# Patient Record
Sex: Female | Born: 1980 | Hispanic: Yes | Marital: Single | Smoking: Never smoker
Health system: Southern US, Community
[De-identification: ages and names within clinical notes are randomized; demographics above are authoritative.]

## PROBLEM LIST (undated history)

## (undated) DIAGNOSIS — I10 Essential (primary) hypertension: Secondary | ICD-10-CM

## (undated) DIAGNOSIS — D219 Benign neoplasm of connective and other soft tissue, unspecified: Secondary | ICD-10-CM

## (undated) DIAGNOSIS — O24419 Gestational diabetes mellitus in pregnancy, unspecified control: Secondary | ICD-10-CM

## (undated) HISTORY — DX: Essential (primary) hypertension: I10

## (undated) HISTORY — DX: Gestational diabetes mellitus in pregnancy, unspecified control: O24.419

## (undated) HISTORY — DX: Benign neoplasm of connective and other soft tissue, unspecified: D21.9

---

## 2014-12-28 ENCOUNTER — Encounter (HOSPITAL_COMMUNITY): Payer: Self-pay

## 2014-12-28 ENCOUNTER — Emergency Department (HOSPITAL_COMMUNITY)
Admission: EM | Admit: 2014-12-28 | Discharge: 2014-12-29 | Disposition: A | Discharge status: Home / Self Care | Payer: Self-pay | Attending: Emergency Medicine | Admitting: Emergency Medicine

## 2014-12-28 DIAGNOSIS — O200 Threatened abortion: Secondary | ICD-10-CM | POA: Insufficient documentation

## 2014-12-28 DIAGNOSIS — O4690 Antepartum hemorrhage, unspecified, unspecified trimester: Secondary | ICD-10-CM

## 2014-12-28 DIAGNOSIS — Z3A01 Less than 8 weeks gestation of pregnancy: Secondary | ICD-10-CM | POA: Insufficient documentation

## 2014-12-28 DIAGNOSIS — N939 Abnormal uterine and vaginal bleeding, unspecified: Secondary | ICD-10-CM

## 2014-12-28 NOTE — ED Notes (Signed)
Pt speaks no english. Used interpreter phone to triage. Pt here for vaginal bleeding that started today. Went to MD 3 days ago for check up and they checked a urine on her and she found out she was pregnant. Has been having vaginal discharge that is white and yellow and has a smell to it for a while. Today noticed some white discharge with blood and wants to be checked out since they told her she was pregnant.

## 2014-12-29 ENCOUNTER — Emergency Department (HOSPITAL_COMMUNITY): Payer: Self-pay

## 2014-12-29 LAB — ABO/RH: ABO/RH(D): O POS

## 2014-12-29 LAB — WET PREP, GENITAL
Clue Cells Wet Prep HPF POC: NONE SEEN
Trich, Wet Prep: NONE SEEN

## 2014-12-29 LAB — I-STAT CHEM 8, ED
BUN: 6 mg/dL (ref 6–20)
Calcium, Ion: 1.16 mmol/L (ref 1.12–1.23)
Chloride: 104 mmol/L (ref 101–111)
Creatinine, Ser: 0.6 mg/dL (ref 0.44–1.00)
Glucose, Bld: 122 mg/dL — ABNORMAL HIGH (ref 65–99)
HEMATOCRIT: 41 % (ref 36.0–46.0)
Hemoglobin: 13.9 g/dL (ref 12.0–15.0)
Potassium: 3.2 mmol/L — ABNORMAL LOW (ref 3.5–5.1)
SODIUM: 140 mmol/L (ref 135–145)
TCO2: 20 mmol/L (ref 0–100)

## 2014-12-29 LAB — GC/CHLAMYDIA PROBE AMP (~~LOC~~) NOT AT ARMC
Chlamydia: NEGATIVE
Neisseria Gonorrhea: NEGATIVE

## 2014-12-29 LAB — HCG, QUANTITATIVE, PREGNANCY: hCG, Beta Chain, Quant, S: 10429 m[IU]/mL — ABNORMAL HIGH (ref <5)

## 2014-12-29 MED ORDER — POTASSIUM CHLORIDE CRYS ER 20 MEQ PO TBCR
40.0000 meq | EXTENDED_RELEASE_TABLET | Freq: Once | ORAL | Status: AC
  Administered 2014-12-29: 40 meq via ORAL
  Filled 2014-12-29: qty 2

## 2014-12-29 NOTE — Discharge Instructions (Signed)
Amenaza de aborto (Threatened Miscarriage) La amenaza de aborto se produce cuando hay hemorragia vaginal durante las primeras 20semanas de [place], pero el embarazo no se interrumpe. Si durante este perodo usted tiene hemorragia vaginal, el mdico le har pruebas para asegurarse de que el embarazo contine. Si las pruebas muestran que usted contina embarazada y que el "beb" en desarrollo (feto) dentro del tero sigue creciendo, se considera que tuvo una [place] de aborto. La amenaza de aborto no implica que el embarazo vaya a Manufacturing engineer, pero s aumenta el riesgo de perder el embarazo (aborto completo). CAUSAS  Por lo general, no se conoce la causa de la amenaza de aborto. Si el resultado final es el aborto completo, la causa ms frecuente es la cantidad anormal de cromosomas del feto. Los cromosomas son las estructuras internas de las clulas que contienen todo Agricultural engineer gentico. Clinical research associate de las causas de hemorragia vaginal que no ocasionan un aborto incluyen:  [place].  [place].  Los cambios hormonales normales durante el [place].  La hemorragia que se produce cuando el vulo se implanta en el tero. FACTORES DE RIESGO Los factores de riesgo de hemorragia al principio del embarazo incluyen:  Obesidad.  Fumar.  El consumo de cantidades excesivas de alcohol o cafena.  El consumo de drogas. SIGNOS Y SNTOMAS  Hemorragia vaginal leve.  Dolor o clicos abdominales leves. DIAGNSTICO  Si tiene hemorragia con o sin dolor abdominal antes de las 20semanas de [place], el mdico le har pruebas para determinar si el embarazo contina. Una prueba importante incluye el uso de ondas sonoras y de una computadora (ecografa) para crear imgenes del interior del tero. Otras pruebas incluyen el examen interno de la vagina y el tero (examen plvico), y el control de la frecuencia cardaca del feto.  Es posible que le diagnostiquen una amenaza de aborto en los  siguientes casos:  La ecografa muestra que el embarazo contina.  La frecuencia cardaca del feto es alta.  El examen plvico muestra que la apertura entre el tero y la vagina (cuello del tero) est cerrada.  Su frecuencia cardaca y su presin arterial estn estables.  Los [employer] de sangre confirman que el embarazo contina. TRATAMIENTO  No se ha demostrado que ningn tratamiento evite que una amenaza de aborto se [place] en un aborto completo. Sin embargo, los cuidados [employer] hogar son importantes.  INSTRUCCIONES PARA EL CUIDADO EN EL HOGAR   Asegrese de asistir a todas las citas de cuidados prenatales. Esto es [employer].  Descanse lo suficiente.  No tenga relaciones sexuales ni use tampones si tiene hemorragia vaginal.  No se haga duchas vaginales.  No fume ni consuma drogas.  No beba alcohol.  Evite la cafena. SOLICITE ATENCIN MDICA SI:  Tiene una ligera hemorragia o manchado vaginal durante el embarazo.  Tiene dolor o clicos en el abdomen.  Tiene fiebre. SOLICITE ATENCIN MDICA DE INMEDIATO SI:  Tiene una hemorragia vaginal abundante.  Elimina cogulos de sangre por la vagina.  Siente dolor en la parte baja de la espalda o clicos abdominales intensos.  Tiene fiebre, escalofros y dolor abdominal intenso. ASEGRESE DE QUE:  Comprende estas instrucciones.  Controlar su afeccin.  Recibir ayuda de inmediato si no mejora o si empeora. Document Released: 01/30/2005 Document Revised: 04/27/2013 [facility] Patient Information 2015 [place]. This information is not intended to replace advice given to you by your health care provider. Make sure you discuss any questions you have with your health care provider.

## 2014-12-29 NOTE — ED Notes (Signed)
EDP at bedside  

## 2014-12-29 NOTE — ED Notes (Signed)
Pt to US.

## 2014-12-29 NOTE — ED Provider Notes (Signed)
CSN: [id number]     Arrival date & time 12/28/14  2300 History  This chart was scribed for Julianne Rice, MD by Meriel Pica, ED Scribe. This patient was seen in room A03C/A03C and the patient's care was started 1:34 AM.   Chief Complaint  Patient presents with  . Vaginal Bleeding   The history is provided by the patient. A language interpreter was used.   HPI Comments: Cassandra Stokes is a 34 y.o. female, who is G:1 P:0, presents to the Emergency Department complaining of a sudden onset episode of vaginal bleeding that occurred 5.5 hours ago. Pt reports this evening she experienced a 'gush' of blood that she describes to be heavier than her baseline menses with 1 blood clot this evening at 8pm. She states following the heavy gush of blood she has experienced mild vaginal spotting. LNMP 7/31. She has not received prenatal care for this pregnancy as of yet. She is unaware of her blood type. Denies abdominal pain, pelvic pain nausea, vomiting, fevers, chills, CP, or dysuria.    History reviewed. No pertinent past medical history. History reviewed. No pertinent past surgical history. No family history on file. Social History  Substance Use Topics  . Smoking status: Never Smoker   . Smokeless tobacco: None  . Alcohol Use: No   OB History    Gravida Para Term Preterm AB TAB SAB Ectopic Multiple Living   1              Review of Systems  Constitutional: Negative for fever and chills.  Gastrointestinal: Negative for nausea, vomiting and abdominal pain.  Genitourinary: Positive for vaginal bleeding and vaginal discharge. Negative for dysuria, flank pain, vaginal pain and pelvic pain.  Musculoskeletal: Negative for back pain.  Skin: Negative for rash.  Neurological: Negative for dizziness, weakness, light-headedness, numbness and headaches.  All other systems reviewed and are negative.  Allergies  Review of patient's allergies indicates no known allergies.  Home  Medications   Prior to Admission medications   Not on File   BP 116/91 mmHg  Pulse 98  Temp(Src) 99 F (37.2 C) (Oral)  Resp 20  SpO2 98%  LMP 12/04/2014 Physical Exam  Constitutional: She is oriented to person, place, and time. She appears well-developed and well-nourished. No distress.  HENT:  Head: Normocephalic and atraumatic.  Mouth/Throat: Oropharynx is clear and moist.  Eyes: EOM are normal. Pupils are equal, round, and reactive to light.  Neck: Normal range of motion. Neck supple.  Cardiovascular: Normal rate and regular rhythm.   Pulmonary/Chest: Effort normal and breath sounds normal. No respiratory distress. She has no wheezes. She has no rales.  Abdominal: Soft. Bowel sounds are normal. She exhibits no distension and no mass. There is no tenderness. There is no rebound and no guarding.  Genitourinary:  No pelvic tenderness. Small amount of brown discharge.  Musculoskeletal: Normal range of motion. She exhibits no edema or tenderness.  No CVA tenderness bilaterally  Neurological: She is alert and oriented to person, place, and time.  Skin: Skin is warm and dry. No rash noted. No erythema.  Psychiatric: She has a normal mood and affect. Her behavior is normal.  Nursing note and vitals reviewed.   ED Course  Procedures  DIAGNOSTIC STUDIES: Oxygen Saturation is 98% on RA, normal by my interpretation.    COORDINATION OF CARE: 1:41 AM Discussed treatment plan with pt. Pt acknowledges and agrees to plan.    Labs Review Labs Reviewed  HCG, QUANTITATIVE, PREGNANCY -  Abnormal; Notable for the following:    hCG, Beta Chain, Quant, S 10429 (*)    All other components within normal limits  I-STAT CHEM 8, ED - Abnormal; Notable for the following:    Potassium 3.2 (*)    Glucose, Bld 122 (*)    All other components within normal limits  WET PREP, GENITAL  ABO/RH  GC/CHLAMYDIA PROBE AMP ([place]) NOT AT [facility]    Imaging Review US Ob Comp Less 14 Wks  12/29/2014    CLINICAL DATA:  Vaginal bleeding in first-trimester pregnancy.  EXAM: OBSTETRIC <14 WK [place] AND TRANSVAGINAL OB US  TECHNIQUE: Both transabdominal and transvaginal ultrasound examinations were performed for complete evaluation of the gestation as well as the maternal uterus, adnexal regions, and pelvic cul-de-sac. Transvaginal technique was performed to assess early pregnancy.  COMPARISON:  None.  FINDINGS: Intrauterine gestational sac: Visualized/normal in shape.  Yolk sac:  Not present.  Embryo:  Present.  Cardiac Activity: Present.  Heart Rate: 149  bpm  CRL:  9  mm   6 w   6 d                  [place] EDC: 08/18/2015  Maternal uterus/adnexae: There is no subchorionic hemorrhage. Heterogeneous myometrial lesions most consistent with fibroids. In the fundus fibroid measures 3.3 x 2.4 cm. Probable exophytic fibroid to the left measures 6.3 x 3.8 x 4.0 cm. The ovaries are not discretely visualized, the structure labeled left ovary on transabdominal exam appears to represent an exophytic fibroid. There is an anechoic cyst superior to the uterus measures 6.1 x 4.2 x 4.8 cm, likely ovarian in etiology, however no definite ovarian tissue is seen. An 2.8 x 2.2 x 2.2 cm cyst is seen to the left of the uterus. Small amount of free fluid in the pelvis.  IMPRESSION: 1. Single live intrauterine pregnancy estimated gestational age [redacted] weeks 6 days for estimated date of delivery 08/18/2015. 2. Uterine fibroids with probable exophytic fibroid on the left. 3. The ovaries are not discretely identified, however adnexal cysts are likely ovarian in etiology. Largest cyst measures 6.1 cm superior to the uterus.   Electronically Signed   By: Jeb Levering M.D.   On: 12/29/2014 04:28   US Ob Transvaginal  12/29/2014   CLINICAL DATA:  Vaginal bleeding in first-trimester pregnancy.  EXAM: OBSTETRIC <14 WK [place] AND TRANSVAGINAL OB US  TECHNIQUE: Both transabdominal and transvaginal ultrasound examinations were performed for complete evaluation  of the gestation as well as the maternal uterus, adnexal regions, and pelvic cul-de-sac. Transvaginal technique was performed to assess early pregnancy.  COMPARISON:  None.  FINDINGS: Intrauterine gestational sac: Visualized/normal in shape.  Yolk sac:  Not present.  Embryo:  Present.  Cardiac Activity: Present.  Heart Rate: 149  bpm  CRL:  9  mm   6 w   6 d                  [place] EDC: 08/18/2015  Maternal uterus/adnexae: There is no subchorionic hemorrhage. Heterogeneous myometrial lesions most consistent with fibroids. In the fundus fibroid measures 3.3 x 2.4 cm. Probable exophytic fibroid to the left measures 6.3 x 3.8 x 4.0 cm. The ovaries are not discretely visualized, the structure labeled left ovary on transabdominal exam appears to represent an exophytic fibroid. There is an anechoic cyst superior to the uterus measures 6.1 x 4.2 x 4.8 cm, likely ovarian in etiology, however no definite ovarian tissue is seen. An 2.8  x 2.2 x 2.2 cm cyst is seen to the left of the uterus. Small amount of free fluid in the pelvis.  IMPRESSION: 1. Single live intrauterine pregnancy estimated gestational age [redacted] weeks 6 days for estimated date of delivery 08/18/2015. 2. Uterine fibroids with probable exophytic fibroid on the left. 3. The ovaries are not discretely identified, however adnexal cysts are likely ovarian in etiology. Largest cyst measures 6.1 cm superior to the uterus.   Electronically Signed   By: Jeb Levering M.D.   On: 12/29/2014 04:28   I have personally reviewed and evaluated these images and lab results as part of my medical decision-making.   EKG Interpretation None      MDM   Final diagnoses:  Vaginal bleeding  Vaginal bleeding in pregnancy    I personally performed the services described in this documentation, which was scribed in my presence. The recorded information has been reviewed and is accurate.   Patient with no further bleeding in the emergency department. Intrauterine pregnancy  noted on ultrasound. Patient is advised to follow-up with women's clinic. Return precautions have been given.  Julianne Rice, MD 12/29/14 [phone]

## 2015-04-13 ENCOUNTER — Encounter: Payer: Self-pay | Admitting: Certified Nurse Midwife

## 2015-04-13 ENCOUNTER — Ambulatory Visit (INDEPENDENT_AMBULATORY_CARE_PROVIDER_SITE_OTHER): Payer: Self-pay | Admitting: Certified Nurse Midwife

## 2015-04-13 VITALS — BP 121/74 | HR 98 | Temp 98.0°F | Ht 64.0 in | Wt 158.4 lb

## 2015-04-13 DIAGNOSIS — Z124 Encounter for screening for malignant neoplasm of cervix: Secondary | ICD-10-CM

## 2015-04-13 DIAGNOSIS — O0990 Supervision of high risk pregnancy, unspecified, unspecified trimester: Secondary | ICD-10-CM | POA: Insufficient documentation

## 2015-04-13 DIAGNOSIS — Z113 Encounter for screening for infections with a predominantly sexual mode of transmission: Secondary | ICD-10-CM

## 2015-04-13 DIAGNOSIS — O0993 Supervision of high risk pregnancy, unspecified, third trimester: Secondary | ICD-10-CM | POA: Insufficient documentation

## 2015-04-13 DIAGNOSIS — Z3482 Encounter for supervision of other normal pregnancy, second trimester: Secondary | ICD-10-CM

## 2015-04-13 DIAGNOSIS — Z3401 Encounter for supervision of normal first pregnancy, first trimester: Secondary | ICD-10-CM

## 2015-04-13 DIAGNOSIS — Z1151 Encounter for screening for human papillomavirus (HPV): Secondary | ICD-10-CM

## 2015-04-13 DIAGNOSIS — Z3402 Encounter for supervision of normal first pregnancy, second trimester: Secondary | ICD-10-CM

## 2015-04-13 LAB — WET PREP, GENITAL
Clue Cells Wet Prep HPF POC: NONE SEEN
Trich, Wet Prep: NONE SEEN
Yeast Wet Prep HPF POC: NONE SEEN

## 2015-04-13 LAB — POCT URINALYSIS DIP (DEVICE)
Bilirubin Urine: NEGATIVE
Glucose, UA: 500 mg/dL — AB
Hgb urine dipstick: NEGATIVE
Leukocytes, UA: NEGATIVE
Nitrite: NEGATIVE
Protein, ur: 30 mg/dL — AB
Specific Gravity, Urine: 1.025 (ref 1.005–1.030)
Urobilinogen, UA: 0.2 mg/dL (ref 0.0–1.0)
pH: 7 (ref 5.0–8.0)

## 2015-04-13 MED ORDER — PROMETHAZINE HCL 25 MG PO TABS: 25.0000 mg | ORAL_TABLET | Freq: Four times a day (QID) | ORAL | Status: DC | PRN

## 2015-04-13 MED ORDER — PRENATAL VITAMINS 0.8 MG PO TABS: 1.0000 | ORAL_TABLET | Freq: Every day | ORAL | Status: DC

## 2015-04-13 NOTE — Progress Notes (Signed)
Pt reports a heavy amt of vaginal discharge stating it looks or appears to be white or yellow in color. Pt reports even before pregnancy this was a problem.  Pt is in need of more prenatal vit.

## 2015-04-13 NOTE — Patient Instructions (Addendum)
Second Trimester of Pregnancy The second trimester is from week 13 through week 28, months 4 through 6. The second trimester is often a time when you feel your best. Your body has also adjusted to being pregnant, and you begin to feel better physically. Usually, morning sickness has lessened or quit completely, you may have more energy, and you may have an increase in appetite. The second trimester is also a time when the fetus is growing rapidly. At the end of the sixth month, the fetus is about 9 inches long and weighs about 1 pounds. You will likely begin to feel the baby move (quickening) between 18 and 20 weeks of the pregnancy. BODY CHANGES Your body goes through many changes during pregnancy. The changes vary from woman to woman.   Your weight will continue to increase. You will notice your lower abdomen bulging out.  You may begin to get stretch marks on your hips, abdomen, and breasts.  You may develop headaches that can be relieved by medicines approved by your health care provider.  You may urinate more often because the fetus is pressing on your bladder.  You may develop or continue to have heartburn as a result of your pregnancy.  You may develop constipation because certain hormones are causing the muscles that push waste through your intestines to slow down.  You may develop hemorrhoids or swollen, bulging veins (varicose veins).  You may have back pain because of the weight gain and pregnancy hormones relaxing your joints between the bones in your pelvis and as a result of a shift in weight and the muscles that support your balance.  Your breasts will continue to grow and be tender.  Your gums may bleed and may be sensitive to brushing and flossing.  Dark spots or blotches (chloasma, mask of pregnancy) may develop on your face. This will likely fade after the baby is born.  A dark line from your belly button to the pubic area (linea nigra) may appear. This will likely fade  after the baby is born.  You may have changes in your hair. These can include thickening of your hair, rapid growth, and changes in texture. Some women also have hair loss during or after pregnancy, or hair that feels dry or thin. Your hair will most likely return to normal after your baby is born. WHAT TO EXPECT AT YOUR PRENATAL VISITS During a routine prenatal visit:  You will be weighed to make sure you and the fetus are growing normally.  Your blood pressure will be taken.  Your abdomen will be measured to track your baby's growth.  The fetal heartbeat will be listened to.  Any test results from the previous visit will be discussed. Your health care provider may ask you:  How you are feeling.  If you are feeling the baby move.  If you have had any abnormal symptoms, such as leaking fluid, bleeding, severe headaches, or abdominal cramping.  If you are using any tobacco products, including cigarettes, chewing tobacco, and electronic cigarettes.  If you have any questions. Other tests that may be performed during your second trimester include:  Blood tests that check for:  Low iron levels (anemia).  Gestational diabetes (between 24 and 28 weeks).  Rh antibodies.  Urine tests to check for infections, diabetes, or protein in the urine.  An ultrasound to confirm the proper growth and development of the baby.  An amniocentesis to check for possible genetic problems.  Fetal screens for spina bifida   and Down syndrome.  HIV (human immunodeficiency virus) testing. Routine prenatal testing includes screening for HIV, unless you choose not to have this test. HOME CARE INSTRUCTIONS   Avoid all smoking, herbs, alcohol, and unprescribed drugs. These chemicals affect the formation and growth of the baby.  Do not use any tobacco products, including cigarettes, chewing tobacco, and electronic cigarettes. If you need help quitting, ask your health care provider. You may receive  counseling support and other resources to help you quit.  Follow your health care provider's instructions regarding medicine use. There are medicines that are either safe or unsafe to take during pregnancy.  Exercise only as directed by your health care provider. Experiencing uterine cramps is a good sign to stop exercising.  Continue to eat regular, healthy meals.  Wear a good support bra for breast tenderness.  Do not use hot tubs, steam rooms, or saunas.  Wear your seat belt at all times when driving.  Avoid raw meat, uncooked cheese, cat litter boxes, and soil used by cats. These carry germs that can cause birth defects in the baby.  Take your prenatal vitamins.  Take 1500-2000 mg of calcium daily starting at the 20th week of pregnancy until you deliver your baby.  Try taking a stool softener (if your health care provider approves) if you develop constipation. Eat more high-fiber foods, such as fresh vegetables or fruit and whole grains. Drink plenty of fluids to keep your urine clear or pale yellow.  Take warm sitz baths to soothe any pain or discomfort caused by hemorrhoids. Use hemorrhoid cream if your health care provider approves.  If you develop varicose veins, wear support hose. Elevate your feet for 15 minutes, 3-4 times a day. Limit salt in your diet.  Avoid heavy lifting, wear low heel shoes, and practice good posture.  Rest with your legs elevated if you have leg cramps or low back pain.  Visit your dentist if you have not gone yet during your pregnancy. Use a soft toothbrush to brush your teeth and be gentle when you floss.  A sexual relationship may be continued unless your health care provider directs you otherwise.  Continue to go to all your prenatal visits as directed by your health care provider. SEEK MEDICAL CARE IF:   You have dizziness.  You have mild pelvic cramps, pelvic pressure, or nagging pain in the abdominal area.  You have persistent nausea,  vomiting, or diarrhea.  You have a bad smelling vaginal discharge.  You have pain with urination. SEEK IMMEDIATE MEDICAL CARE IF:   You have a fever.  You are leaking fluid from your vagina.  You have spotting or bleeding from your vagina.  You have severe abdominal cramping or pain.  You have rapid weight gain or loss.  You have shortness of breath with chest pain.  You notice sudden or extreme swelling of your face, hands, ankles, feet, or legs.  You have not felt your baby move in over an hour.  You have severe headaches that do not go away with medicine.  You have vision changes.   This information is not intended to replace advice given to you by your health care provider. Make sure you discuss any questions you have with your health care provider.   Document Released: 04/16/2001 Document Revised: 05/13/2014 Document Reviewed: 06/23/2012 Elsevier Interactive Patient Education 2016 [employer]    Safe Medications in Pregnancy   Acne: Benzoyl Peroxide Salicylic Acid  Backache/Headache: Tylenol: 2 regular strength every 4   hours OR              2 Extra strength every 6 hours  Colds/Coughs/Allergies: Benadryl (alcohol free) 25 mg every 6 hours as needed Breath right strips Claritin Cepacol throat lozenges Chloraseptic throat spray Cold-Eeze- up to three times per day Cough drops, alcohol free Flonase (by prescription only) Guaifenesin Mucinex Robitussin DM (plain only, alcohol free) Saline nasal spray/drops Sudafed (pseudoephedrine) & Actifed ** use only after [redacted] weeks gestation and if you do not have high blood pressure Tylenol Vicks Vaporub Zinc lozenges Zyrtec   Constipation: Colace Ducolax suppositories Fleet enema Glycerin suppositories Metamucil Milk of magnesia Miralax Senokot Smooth move tea  Diarrhea: Kaopectate Imodium A-D  *NO pepto Bismol  Hemorrhoids: Anusol Anusol HC Preparation  H Tucks  Indigestion: Tums Maalox Mylanta Zantac  Pepcid  Insomnia: Benadryl (alcohol free) 25mg every 6 hours as needed Tylenol PM Unisom, no Gelcaps  Leg Cramps: Tums MagGel  Nausea/Vomiting:  Bonine Dramamine Emetrol Ginger extract Sea bands Meclizine  Nausea medication to take during pregnancy:  Unisom (doxylamine succinate 25 mg tablets) Take one tablet daily at bedtime. If symptoms are not adequately controlled, the dose can be increased to a maximum recommended dose of two tablets daily (1/2 tablet in the morning, 1/2 tablet mid-afternoon and one at bedtime). Vitamin B6 100mg tablets. Take one tablet twice a day (up to 200 mg per day).  Skin Rashes: Aveeno products Benadryl cream or 25mg every 6 hours as needed Calamine Lotion 1% cortisone cream  Yeast infection: Gyne-lotrimin 7 Monistat 7   **If taking multiple medications, please check labels to avoid duplicating the same active ingredients **take medication as directed on the label ** Do not exceed 4000 mg of tylenol in 24 hours **Do not take medications that contain aspirin or ibuprofen     

## 2015-04-13 NOTE — Addendum Note (Signed)
Addended by: Mason Jim A on: 04/13/2015 02:14 PM   Modules accepted: Orders

## 2015-04-13 NOTE — Progress Notes (Signed)
Subjective:   Clinic Low Risk clinic Prenatal Labs  Dating LMP Blood type: --/--/O POS (08/25 0220)   Genetic Screen 1 Screen:   Too late AFP:   declined  Quad:    declined NIPS: Antibody:   Anatomic [place] scheduled Rubella:    GTT Early:               Third trimester:  RPR:     Flu vaccine  HBsAg:     TDaP vaccine                                               Rhogam: HIV:     Baby Food           Breast                                    GBS: (For PCN allergy, check sensitivities)  Contraception  Pap:04/13/15  Circumcision    Pediatrician    Support Person       Diasy Savitz is a G1P0 [redacted]w[redacted]d being seen today for her first obstetrical visit.  Her obstetrical history is significant for none. Patient does intend to breast feed. Pregnancy history fully reviewed.  Patient reports nausea and vomiting.  Filed Vitals:   04/13/15 1316 04/13/15 1320  BP: 121/74   Pulse: 98   Temp: 98 F (36.7 C)   Height:  5\' 4"  (1.626 m)  Weight: 158 lb 6.4 oz (71.85 kg)     HISTORY: OB History  Gravida Para Term Preterm AB SAB TAB Ectopic Multiple Living  1             # Outcome Date GA Lbr Len/2nd Weight Sex Delivery Anes PTL Lv  1 Current              History reviewed. No pertinent past medical history. History reviewed. No pertinent past surgical history. Family History  Problem Relation Age of Onset  . Vision loss Father      Exam    Uterus:     Pelvic Exam:    Perineum: No Hemorrhoids   Vulva: normal   Vagina:  curdlike discharge, wet prep done   pH:    Cervix: closed   Adnexa: not evaluated   Bony Pelvis: gynecoid  System: Breast:  normal appearance, no masses or tenderness   Skin: normal coloration and turgor, no rashes    Neurologic: oriented, normal   Extremities: normal strength, tone, and muscle mass   HEENT    Mouth/Teeth mucous membranes moist, pharynx normal without lesions   Neck supple and no masses   Cardiovascular: regular rate and rhythm    Respiratory:  appears well, vitals normal, no respiratory distress, acyanotic, normal RR, ear and throat exam is normal, neck free of mass or lymphadenopathy, chest clear, no wheezing, crepitations, rhonchi, normal symmetric air entry   Abdomen: soft, non-tender; bowel sounds normal; no masses,  no organomegaly   Urinary: urethral meatus normal      Assessment:    Pregnancy: G1P0 Patient Active Problem List   Diagnosis Date Noted  . Encounter for supervision of other normal pregnancy in second trimester 04/13/2015        Plan:     Initial labs drawn. Prenatal vitamins. Phenergan 25mg   PO #30 Problem list reviewed and updated. Genetic Screening discussed : ordered.  Ultrasound discussed; fetal survey: ordered.  Follow up in 4 weeks. 30% of 50 min visit spent on counseling and coordination of care.     Clemmons,Lori Grissett 04/13/2015

## 2015-04-14 ENCOUNTER — Encounter: Payer: Self-pay | Admitting: *Deleted

## 2015-04-14 LAB — PRENATAL PROFILE (SOLSTAS)
Antibody Screen: NEGATIVE
Basophils Absolute: 0 10*3/uL (ref 0.0–0.1)
Basophils Relative: 0 % (ref 0–1)
Eosinophils Absolute: 0 10*3/uL (ref 0.0–0.7)
Eosinophils Relative: 0 % (ref 0–5)
HCT: 34.8 % — ABNORMAL LOW (ref 36.0–46.0)
HIV 1&2 Ab, 4th Generation: NONREACTIVE
Hemoglobin: 11.7 g/dL — ABNORMAL LOW (ref 12.0–15.0)
Hepatitis B Surface Ag: NEGATIVE
Lymphocytes Relative: 20 % (ref 12–46)
Lymphs Abs: 1.8 10*3/uL (ref 0.7–4.0)
MCH: 30.9 pg (ref 26.0–34.0)
MCHC: 33.6 g/dL (ref 30.0–36.0)
MCV: 91.8 fL (ref 78.0–100.0)
MPV: 10.3 fL (ref 8.6–12.4)
Monocytes Absolute: 0.7 10*3/uL (ref 0.1–1.0)
Monocytes Relative: 8 % (ref 3–12)
Neutro Abs: 6.5 10*3/uL (ref 1.7–7.7)
Neutrophils Relative %: 72 % (ref 43–77)
Platelets: 288 10*3/uL (ref 150–400)
RBC: 3.79 MIL/uL — ABNORMAL LOW (ref 3.87–5.11)
RDW: 13.3 % (ref 11.5–15.5)
Rh Type: POSITIVE
Rubella: 2.27 Index — ABNORMAL HIGH (ref <0.90)
WBC: 9 10*3/uL (ref 4.0–10.5)

## 2015-04-14 LAB — PRESCRIPTION MONITORING PROFILE (19 PANEL)
Amphetamine/Meth: NEGATIVE ng/mL
Barbiturate Screen, Urine: NEGATIVE ng/mL
Benzodiazepine Screen, Urine: NEGATIVE ng/mL
Buprenorphine, Urine: NEGATIVE ng/mL
Cannabinoid Scrn, Ur: NEGATIVE ng/mL
Carisoprodol, Urine: NEGATIVE ng/mL
Cocaine Metabolites: NEGATIVE ng/mL
Creatinine, Urine: 154.79 mg/dL (ref >20.0)
Fentanyl, Ur: NEGATIVE ng/mL
MDMA URINE: NEGATIVE ng/mL
Meperidine, Ur: NEGATIVE ng/mL
Methadone Screen, Urine: NEGATIVE ng/mL
Methaqualone: NEGATIVE ng/mL
Nitrites, Initial: NEGATIVE ug/mL
Opiate Screen, Urine: NEGATIVE ng/mL
Oxycodone Screen, Ur: NEGATIVE ng/mL
Phencyclidine, Ur: NEGATIVE ng/mL
Propoxyphene: NEGATIVE ng/mL
Tapentadol, urine: NEGATIVE ng/mL
Tramadol Scrn, Ur: NEGATIVE ng/mL
Zolpidem, Urine: NEGATIVE ng/mL
pH, Initial: 6.9 pH (ref 4.5–8.9)

## 2015-04-14 LAB — GC/CHLAMYDIA PROBE AMP (~~LOC~~) NOT AT ARMC
Chlamydia: NEGATIVE
Neisseria Gonorrhea: NEGATIVE

## 2015-04-14 LAB — CULTURE, OB URINE
Colony Count: NO GROWTH
Organism ID, Bacteria: NO GROWTH

## 2015-04-17 LAB — HEMOGLOBINOPATHY EVALUATION
Hemoglobin Other: 0 %
Hgb A2 Quant: 2.9 % (ref 2.2–3.2)
Hgb A: 96.4 % — ABNORMAL LOW (ref 96.8–97.8)
Hgb F Quant: 0.7 % (ref 0.0–2.0)
Hgb S Quant: 0 %

## 2015-04-18 LAB — CYTOLOGY - PAP

## 2015-04-20 ENCOUNTER — Other Ambulatory Visit: Payer: Self-pay | Admitting: Certified Nurse Midwife

## 2015-04-20 ENCOUNTER — Ambulatory Visit (HOSPITAL_COMMUNITY)
Admission: RE | Admit: 2015-04-20 | Discharge: 2015-04-20 | Disposition: A | Discharge status: Home / Self Care | Payer: Self-pay | Source: Ambulatory Visit | Attending: Certified Nurse Midwife | Admitting: Certified Nurse Midwife

## 2015-04-20 ENCOUNTER — Encounter (HOSPITAL_COMMUNITY): Payer: Self-pay

## 2015-04-20 DIAGNOSIS — O3412 Maternal care for benign tumor of corpus uteri, second trimester: Secondary | ICD-10-CM

## 2015-04-20 DIAGNOSIS — Z3A19 19 weeks gestation of pregnancy: Secondary | ICD-10-CM

## 2015-04-20 DIAGNOSIS — D259 Leiomyoma of uterus, unspecified: Secondary | ICD-10-CM

## 2015-04-20 DIAGNOSIS — O358XX0 Maternal care for other (suspected) fetal abnormality and damage, not applicable or unspecified: Secondary | ICD-10-CM

## 2015-04-20 DIAGNOSIS — O35AXX0 Maternal care for other (suspected) fetal abnormality and damage, fetal facial anomalies, not applicable or unspecified: Secondary | ICD-10-CM

## 2015-04-20 DIAGNOSIS — Z3A22 22 weeks gestation of pregnancy: Secondary | ICD-10-CM

## 2015-04-20 DIAGNOSIS — Z3689 Encounter for other specified antenatal screening: Secondary | ICD-10-CM

## 2015-04-20 DIAGNOSIS — N83209 Unspecified ovarian cyst, unspecified side: Secondary | ICD-10-CM

## 2015-04-20 DIAGNOSIS — O3480 Maternal care for other abnormalities of pelvic organs, unspecified trimester: Secondary | ICD-10-CM

## 2015-04-20 DIAGNOSIS — O35HXX0 Maternal care for other (suspected) fetal abnormality and damage, fetal lower extremities anomalies, not applicable or unspecified: Secondary | ICD-10-CM

## 2015-04-20 DIAGNOSIS — Z36 Encounter for antenatal screening of mother: Secondary | ICD-10-CM | POA: Insufficient documentation

## 2015-04-20 DIAGNOSIS — Z315 Encounter for genetic counseling: Secondary | ICD-10-CM | POA: Insufficient documentation

## 2015-04-21 ENCOUNTER — Encounter: Payer: Self-pay | Admitting: Advanced Practice Midwife

## 2015-04-21 DIAGNOSIS — R87612 Low grade squamous intraepithelial lesion on cytologic smear of cervix (LGSIL): Secondary | ICD-10-CM

## 2015-04-21 DIAGNOSIS — O35HXX0 Maternal care for other (suspected) fetal abnormality and damage, fetal lower extremities anomalies, not applicable or unspecified: Secondary | ICD-10-CM | POA: Insufficient documentation

## 2015-04-21 DIAGNOSIS — O3410 Maternal care for benign tumor of corpus uteri, unspecified trimester: Secondary | ICD-10-CM

## 2015-04-21 DIAGNOSIS — O3442 Maternal care for other abnormalities of cervix, second trimester: Secondary | ICD-10-CM | POA: Insufficient documentation

## 2015-04-21 DIAGNOSIS — O3480 Maternal care for other abnormalities of pelvic organs, unspecified trimester: Secondary | ICD-10-CM

## 2015-04-21 DIAGNOSIS — N83209 Unspecified ovarian cyst, unspecified side: Secondary | ICD-10-CM | POA: Insufficient documentation

## 2015-04-21 DIAGNOSIS — O35AXX0 Maternal care for other (suspected) fetal abnormality and damage, fetal facial anomalies, not applicable or unspecified: Secondary | ICD-10-CM | POA: Insufficient documentation

## 2015-04-21 DIAGNOSIS — D259 Leiomyoma of uterus, unspecified: Secondary | ICD-10-CM | POA: Insufficient documentation

## 2015-04-21 DIAGNOSIS — O358XX0 Maternal care for other (suspected) fetal abnormality and damage, not applicable or unspecified: Secondary | ICD-10-CM | POA: Insufficient documentation

## 2015-04-21 NOTE — Progress Notes (Signed)
Genetic Counseling  High-Risk Gestation Note  Appointment Date:  04/20/2015 Referred By: Cassandra Stokes, Cassandra Stokes Date of Birth:  Oct 10, 1980   Pregnancy History: G1P0 Estimated Date of Delivery: 08/18/15 Estimated Gestational Age: 79w6dAttending: MRenella Cunas MD  I met with Ms. Cassandra ButtersYRaquel Jamesfor genetic counseling because of abnormal ultrasound findings on today's exam. [facility] telephonic Spanish/English interpreter #[phone] interpretation for today's genetic counseling visit.   In Summary:   Targeted ultrasound performed today visualized bilateral clubfoot, bilateral cleft lip and palate, abnormal spine (?hemivertebrae), and possible heart abnormality  Discussed increased risk for underlying chromosome, single gene, or teratogenic etiology  Recommend patient have screening for diabetes  Patient declined amniocentesis  She is interested in pursuing NIPS to try to determine a possible cause for ultrasound findings but declined today given concern about financial cost of NIPS ((~[phone]  Follow-up ultrasound is planned for 05/10/15  [facility]  Ms. Cassandra ButtersYRaquel Jameswas seen for ultrasound today, and the following were visualized: bilateral club feet, bilateral cleft lip and palate, possible fetal heart defect, and possible abnormal spine (?hemivertebrae).  Complete ultrasound results reported under separate cover.     We discussed these findings in detail.  Specifically, we discussed that congenital anomalies can occur as isolated, nonsyndromic birth defects, or as features of an underlying genetic syndrome. We discussed cleft lip and palate as well as club foot as separate entities in detail but discussed that the combination of ultrasound findings is more suggestive of a single underlying etiology such as a chromosome, single gene, or teratogenic cause.    Cleft lip and palate (CL/P) is most often an isolated condition, but can be present in combination with other birth defects possibly as part of a genetic syndrome. We discussed that postnatal approach to management and treatment of cleft lip and palate. Approximately, 7-13% of individuals with a cleft lip and 11-14% of individuals with a cleft lip and palate are born with additional birth defects. When there is no syndrome as the cause, then the cleft lip or palate is typically suspected to be caused by a combination of genetic and environmental factors (multifactorial inheritance).   Clubfoot/feet is a term that actually describes three distinct anomalies (talipes equinovarus, talipes calcaneovalgus, and metatarsus varus) and occurs in 1 in 1000 births. The most common type of clubfeet, talipes equinovarus, is characterized by forefoot adduction with supination, heel varus, and ankle equines, which cannot be brought back to a neutral position. We discussed an overview of postnatal management and treatment for congenital club foot. The patient was counseled that clubfeet can be an isolated difference, occur as a feature of an underlying syndrome, or the result of neurological impairment. When nonsyndromic, isolated clubfoot/feet is most likely suspected to be multifactorial. There is a known genetic component for multifactorial clubfoot, as demonstrated by twin studies; environmental factors also play a role, including infection, drugs, and intrauterine environment (oligohydramnios, fetal positioning). While the majority of cases of clubfoot/feet are isolated, it is a feature in more than 200 known genetic syndromes, including both chromosomal and single gene conditions.   The risk for a genetic etiology increases with the presence of multiple fetal anatomic differences.  Based on the combination of ultrasound findings, there is a significant increased risk for an underlying chromosome or genetic condition  to be present. We reviewed chromosomes, nondisjunction, and the common features of Down syndrome, trisomy 1100 and trisomy 131  In addition, we discussed the risk for other chromosome aberrations, which can include microdeletions, duplications, insertions, and translocations. We briefly discussed that there are a wide range of single gene conditions and associations, such as VACTERL, that can cause birth defects and that prenatal screening and testing is not available for these conditions for the most part. Additionally, the ultrasound findings could be caused due to a teratogenic exposure or underlying maternal condition, such as diabetes mellitus. [name] reported no exposures during the pregnancy. [name] reported no known personal history of diabetes. We discussed the importance of [name] being screened for diabetes in the current pregnancy.   [name] was counseled regarding the option of noninvasive prenatal screening (NIPS)/cell free fetal DNA (cffDNA) testing. We reviewed that although highly specific and sensitive, this testing is not considered to be diagnostic and does not detect all chromosome aberrations. We reviewed the detection and false positive rates of NIPS ([facility]orama) as well as the associated cost. The patient is currently self-pay and is concerned about additional costs of testing.  We then discussed the option of amniocentesis, including the limitations, benefits, and risks. Amniocentesis allows evaluation of the fetal chromosomes, but cannot detect all genetic conditions. Additionally, microarray analysis can be performed prenatally via amniocentesis or postnatally.  Many genetic syndromes are associated with cleft lip and/or palate as well as club foot which may not be identified on ultrasound and would not be detected by noninvasive prenatal screening (NIPS)/cell free DNA testing or amniocentesis. This is in part because some  genetic conditions may not yet have identified causative genes for which genetic testing is available.   For this reason, a genetics evaluation may be recommended sometime after birth in order to assess for an underlying genetic syndrome.    After thoughtful consideration, [name] declined amniocentesis. She expressed interest in NIPS given her interest in determining the cause for the ultrasound findings and potentially more information about prognosis. However, she declined NIPS at this time given the financial cost of the test and plans to further consider this option.    [name] was counseled that the prognosis and postnatal management depend on the underlying etiology of the fetal differences, and we discussed that this information may not be able to be determined prenatally. We then discussed the option of serial sonography, fetal echocardiogram, and a postnatal consultation with a medical geneticist, if warranted.  She understands that an accurate discussion of prognosis is challenging, without additional information.  We also discussed the option of continuing the pregnancy, termination of pregnancy (TOP), and adoption.  TOP is a legal option in [place] until [redacted] weeks gestation but is available in other states at later gestation. The patient did not express interest in further discussion of TOP or adoption options. Follow-up ultrasound was planned for 05/10/15, and fetal echocardiogram is being scheduled through [facility].     Both family histories were reviewed and found to be noncontributory for birth defects, intellectual disability, and known genetic conditions. Without further information regarding the provided family history, an accurate genetic risk cannot be calculated. Further genetic counseling is warranted if more information is obtained. Consanguinity was denied for the couple.   [name] denied exposure to  environmental toxins or chemical agents. She denied the use of alcohol, tobacco or street drugs. She denied significant viral illnesses during the course of her pregnancy. Her medical and surgical histories were noncontributory.  I counseled [name] regarding the above risks and available options.  The approximate face-to-face time with the genetic counselor was 35 minutes.  Chipper Oman, MS Certified Genetic Counselor 04/21/2015

## 2015-05-10 ENCOUNTER — Ambulatory Visit (HOSPITAL_COMMUNITY): Payer: Self-pay | Attending: Certified Nurse Midwife

## 2015-05-10 ENCOUNTER — Encounter: Payer: Self-pay | Admitting: Advanced Practice Midwife

## 2015-05-10 ENCOUNTER — Other Ambulatory Visit (HOSPITAL_COMMUNITY): Payer: Self-pay | Admitting: Maternal and Fetal Medicine

## 2015-05-10 DIAGNOSIS — O359XX0 Maternal care for (suspected) fetal abnormality and damage, unspecified, not applicable or unspecified: Secondary | ICD-10-CM

## 2015-05-10 DIAGNOSIS — O358XX0 Maternal care for other (suspected) fetal abnormality and damage, not applicable or unspecified: Secondary | ICD-10-CM

## 2015-05-10 DIAGNOSIS — D259 Leiomyoma of uterus, unspecified: Secondary | ICD-10-CM

## 2015-05-10 DIAGNOSIS — O3412 Maternal care for benign tumor of corpus uteri, second trimester: Principal | ICD-10-CM

## 2015-05-10 DIAGNOSIS — N83209 Unspecified ovarian cyst, unspecified side: Secondary | ICD-10-CM

## 2015-05-10 DIAGNOSIS — O3480 Maternal care for other abnormalities of pelvic organs, unspecified trimester: Secondary | ICD-10-CM

## 2015-05-29 ENCOUNTER — Ambulatory Visit (INDEPENDENT_AMBULATORY_CARE_PROVIDER_SITE_OTHER): Payer: Self-pay | Admitting: Obstetrics & Gynecology

## 2015-05-29 VITALS — BP 123/75 | HR 80 | Temp 97.9°F | Wt 161.0 lb

## 2015-05-29 DIAGNOSIS — O3413 Maternal care for benign tumor of corpus uteri, third trimester: Secondary | ICD-10-CM

## 2015-05-29 DIAGNOSIS — N898 Other specified noninflammatory disorders of vagina: Secondary | ICD-10-CM

## 2015-05-29 DIAGNOSIS — O09892 Supervision of other high risk pregnancies, second trimester: Secondary | ICD-10-CM

## 2015-05-29 DIAGNOSIS — Z23 Encounter for immunization: Secondary | ICD-10-CM

## 2015-05-29 DIAGNOSIS — R87612 Low grade squamous intraepithelial lesion on cytologic smear of cervix (LGSIL): Secondary | ICD-10-CM

## 2015-05-29 DIAGNOSIS — O358XX0 Maternal care for other (suspected) fetal abnormality and damage, not applicable or unspecified: Secondary | ICD-10-CM

## 2015-05-29 DIAGNOSIS — O3442 Maternal care for other abnormalities of cervix, second trimester: Secondary | ICD-10-CM

## 2015-05-29 DIAGNOSIS — O283 Abnormal ultrasonic finding on antenatal screening of mother: Secondary | ICD-10-CM

## 2015-05-29 LAB — POCT URINALYSIS DIP (DEVICE)
Bilirubin Urine: NEGATIVE
Glucose, UA: 500 mg/dL — AB
KETONES UR: 15 mg/dL — AB
Leukocytes, UA: NEGATIVE
Nitrite: NEGATIVE
PH: 6.5 (ref 5.0–8.0)
PROTEIN: NEGATIVE mg/dL
Specific Gravity, Urine: 1.02 (ref 1.005–1.030)
UROBILINOGEN UA: 0.2 mg/dL (ref 0.0–1.0)

## 2015-05-29 LAB — CBC
HEMATOCRIT: 34.3 % — AB (ref 36.0–46.0)
HEMOGLOBIN: 11.7 g/dL — AB (ref 12.0–15.0)
MCH: 30.7 pg (ref 26.0–34.0)
MCHC: 34.1 g/dL (ref 30.0–36.0)
MCV: 90 fL (ref 78.0–100.0)
MPV: 10.1 fL (ref 8.6–12.4)
Platelets: 315 10*3/uL (ref 150–400)
RBC: 3.81 MIL/uL — ABNORMAL LOW (ref 3.87–5.11)
RDW: 13.7 % (ref 11.5–15.5)
WBC: 7.9 10*3/uL (ref 4.0–10.5)

## 2015-05-29 MED ORDER — TETANUS-DIPHTH-ACELL PERTUSSIS 5-2.5-18.5 LF-MCG/0.5 IM SUSP
0.5000 mL | Freq: Once | INTRAMUSCULAR | Status: AC
  Administered 2015-05-29: 0.5 mL via INTRAMUSCULAR

## 2015-05-29 NOTE — Progress Notes (Signed)
U/S scheduled for 06/06/2015 @10 :00AM

## 2015-05-29 NOTE — Progress Notes (Signed)
Subjective:  Cassandra Stokes is a 35 y.o. G1P0 at [redacted]w[redacted]d being seen today for ongoing prenatal care.  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  Patient reports vaginal itching and discharge.  Contractions: Not present. Vag. Bleeding: None.  Movement: Present. Denies leaking of fluid.   The following portions of the patient's history were reviewed and updated as appropriate: allergies, current medications, past family history, past medical history, past social history, past surgical history and problem list. Problem list updated.  Objective:   Filed Vitals:   05/29/15 1124  BP: 123/75  Pulse: 80  Temp: 97.9 F (36.6 C)  Weight: 161 lb (73.029 kg)    Fetal Status: Fetal Heart Rate (bpm): 156 Fundal Height: 30 cm Movement: Present     General:  Alert, oriented and cooperative. Patient is in no acute distress.  Skin: Skin is warm and dry. No rash noted.   Cardiovascular: Normal heart rate noted  Respiratory: Normal respiratory effort, no problems with respiration noted  Abdomen: Soft, gravid, appropriate for gestational age. Pain/Pressure: Present     Pelvic: Vag. Bleeding: None Vag D/C Character: Other (Comment)   Cervical exam performed        Extremities: Normal range of motion.  Edema: None  Mental Status: Normal mood and affect. Normal behavior. Normal judgment and thought content.   Urinalysis: Urine Protein: Negative Urine Glucose: 3+  Assessment and Plan:  Pregnancy: G1P0 at [redacted]w[redacted]d  1. Supervision of other high risk pregnancies, second  trimester -Tdap - Glucose Tolerance, 1 HR (50g) w/o Fasting - CBC  2. Multiple congenital anomalies of fetus on prenatal ultrasound Pt states that she has no idea that the baby had heart or spinal issues on last [place].  She remembers seeing genetics, but only remembers the fetus having cleft lip and club feet.  Pt encouraged to keep all appointments - [place] MFM OB FOLLOW UP; Future - US Fetal Echocardiography; Future  3. 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  Given 3rd trimester, will defer to 6 weeks postpartum.  4. Need for Tdap vaccination - Tdap (BOOSTRIX) injection 0.5 mL; Inject 0.5 mLs into the muscle once.  5. Vaginal discharge - Wet prep, genital  Preterm labor symptoms and general obstetric precautions including but not limited to vaginal bleeding, contractions, leaking of fluid and fetal movement were reviewed in detail with the patient. Please refer to After Visit Summary for other counseling recommendations.  Return in about 2 weeks (around 06/12/2015).   Guss Bunde, MD

## 2015-05-29 NOTE — Progress Notes (Signed)
Spanish interpreter Lockie Mola Vaginal discharge- yellowish with odor and some itching  28 wk labs today Tdap consented and given  Pt informed me that she wanted to know if she could push back the appt of her [place] because she just had one a week ago.  I explained to the pt with Spanish interpreter Verdis Frederickson, that it is recommended that she has a f/u US to complete her anatomy 4-6 wks from her last [place].    Pt stated "well it is not going to change things so why get the [place]".  I explained to the pt that the [place] scheduled will look more at the parts that we not clear to confirm.  Pt stated that she will go to her [place] scheduled on 06/06/15.

## 2015-05-30 ENCOUNTER — Other Ambulatory Visit: Payer: Self-pay | Admitting: Obstetrics & Gynecology

## 2015-05-30 LAB — WET PREP, GENITAL
TRICH WET PREP: NONE SEEN
WBC, Wet Prep HPF POC: NONE SEEN
Yeast Wet Prep HPF POC: NONE SEEN

## 2015-05-30 LAB — GLUCOSE TOLERANCE, 1 HOUR (50G) W/O FASTING: Glucose, 1 Hour GTT: 106 mg/dL (ref 70–140)

## 2015-05-30 MED ORDER — METRONIDAZOLE 500 MG PO TABS: 500.0000 mg | ORAL_TABLET | Freq: Two times a day (BID) | ORAL | Status: DC

## 2015-05-31 NOTE — Progress Notes (Signed)
Patient has BV and flagyl has been sent to pharmacy. Called patient with pacific interpreter [phone] and informed her of BV and medication instructions. Patient verbalized understanding & had no questions

## 2015-06-06 ENCOUNTER — Ambulatory Visit (HOSPITAL_COMMUNITY): Payer: Self-pay

## 2015-06-07 ENCOUNTER — Ambulatory Visit (HOSPITAL_COMMUNITY)
Admission: RE | Admit: 2015-06-07 | Discharge: 2015-06-07 | Disposition: A | Discharge status: Home / Self Care | Payer: Medicaid Other | Source: Ambulatory Visit | Attending: Obstetrics & Gynecology | Admitting: Obstetrics & Gynecology

## 2015-06-07 ENCOUNTER — Other Ambulatory Visit: Payer: Self-pay | Admitting: Obstetrics & Gynecology

## 2015-06-07 VITALS — BP 131/71 | HR 91 | Wt 160.6 lb

## 2015-06-07 DIAGNOSIS — O358XX0 Maternal care for other (suspected) fetal abnormality and damage, not applicable or unspecified: Secondary | ICD-10-CM

## 2015-06-07 DIAGNOSIS — D259 Leiomyoma of uterus, unspecified: Secondary | ICD-10-CM | POA: Insufficient documentation

## 2015-06-07 DIAGNOSIS — Z3A29 29 weeks gestation of pregnancy: Secondary | ICD-10-CM | POA: Insufficient documentation

## 2015-06-07 DIAGNOSIS — O3482 Maternal care for other abnormalities of pelvic organs, second trimester: Secondary | ICD-10-CM

## 2015-06-07 DIAGNOSIS — O35HXX0 Maternal care for other (suspected) fetal abnormality and damage, fetal lower extremities anomalies, not applicable or unspecified: Secondary | ICD-10-CM

## 2015-06-07 DIAGNOSIS — N83209 Unspecified ovarian cyst, unspecified side: Secondary | ICD-10-CM

## 2015-06-07 DIAGNOSIS — O3413 Maternal care for benign tumor of corpus uteri, third trimester: Secondary | ICD-10-CM | POA: Diagnosis not present

## 2015-06-07 DIAGNOSIS — O3412 Maternal care for benign tumor of corpus uteri, second trimester: Secondary | ICD-10-CM

## 2015-06-07 DIAGNOSIS — O3483 Maternal care for other abnormalities of pelvic organs, third trimester: Secondary | ICD-10-CM | POA: Insufficient documentation

## 2015-06-07 DIAGNOSIS — O283 Abnormal ultrasonic finding on antenatal screening of mother: Secondary | ICD-10-CM | POA: Diagnosis not present

## 2015-06-12 ENCOUNTER — Encounter: Payer: Self-pay | Admitting: Family Medicine

## 2015-06-19 ENCOUNTER — Ambulatory Visit (INDEPENDENT_AMBULATORY_CARE_PROVIDER_SITE_OTHER): Payer: Self-pay | Admitting: Obstetrics & Gynecology

## 2015-06-19 VITALS — BP 129/75 | HR 80 | Wt 161.0 lb

## 2015-06-19 DIAGNOSIS — O358XX1 Maternal care for other (suspected) fetal abnormality and damage, fetus 1: Secondary | ICD-10-CM

## 2015-06-19 DIAGNOSIS — O35HXX1 Maternal care for other (suspected) fetal abnormality and damage, fetal lower extremities anomalies, fetus 1: Secondary | ICD-10-CM

## 2015-06-19 DIAGNOSIS — O35AXX1 Maternal care for other (suspected) fetal abnormality and damage, fetal facial anomalies, fetus 1: Secondary | ICD-10-CM

## 2015-06-19 DIAGNOSIS — O26893 Other specified pregnancy related conditions, third trimester: Secondary | ICD-10-CM

## 2015-06-19 DIAGNOSIS — R300 Dysuria: Secondary | ICD-10-CM

## 2015-06-19 DIAGNOSIS — O09892 Supervision of other high risk pregnancies, second trimester: Secondary | ICD-10-CM

## 2015-06-19 DIAGNOSIS — O358XX0 Maternal care for other (suspected) fetal abnormality and damage, not applicable or unspecified: Secondary | ICD-10-CM

## 2015-06-19 DIAGNOSIS — Z23 Encounter for immunization: Secondary | ICD-10-CM

## 2015-06-19 LAB — POCT URINALYSIS DIP (DEVICE)
BILIRUBIN URINE: NEGATIVE
GLUCOSE, UA: 100 mg/dL — AB
Nitrite: NEGATIVE
Protein, ur: 30 mg/dL — AB
SPECIFIC GRAVITY, URINE: 1.025 (ref 1.005–1.030)
UROBILINOGEN UA: 0.2 mg/dL (ref 0.0–1.0)
pH: 6.5 (ref 5.0–8.0)

## 2015-06-19 NOTE — Progress Notes (Signed)
Subjective:  Cassandra Stokes is a 35 y.o. G1P0 at [redacted]w[redacted]d being seen today for ongoing prenatal care.  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  Patient reports no complaints.  Contractions: Irritability. Vag. Bleeding: None.  Movement: Present. Denies leaking of fluid.   The following portions of the patient's history were reviewed and updated as appropriate: allergies, current medications, past family history, past medical history, past social history, past surgical history and problem list. Problem list updated.  Objective:   Filed Vitals:   06/19/15 1103  BP: 129/75  Pulse: 80  Weight: 161 lb (73.029 kg)    Fetal Status:     Movement: Present     General:  Alert, oriented and cooperative. Patient is in no acute distress.  Skin: Skin is warm and dry. No rash noted.   Cardiovascular: Normal heart rate noted  Respiratory: Normal respiratory effort, no problems with respiration noted  Abdomen: Soft, gravid, appropriate for gestational age. Pain/Pressure: Present     Pelvic: Vag. Bleeding: None     Cervical exam deferred        Extremities: Normal range of motion.  Edema: None  Mental Status: Normal mood and affect. Normal behavior. Normal judgment and thought content.   Urinalysis:      Assessment and Plan:  Pregnancy: G1P0 at [redacted]w[redacted]d  1. Club foot, fetal, affecting care of mother, antepartum, fetus 1 F/U MFM  - Flu Vaccine QUAD 36+ mos IM; Standing - Flu Vaccine QUAD 36+ mos IM  2. Fetal cleft lip and palate affecting antepartum care of mother, fetus  1 - F/U MFM 07/05/15 - Flu Vaccine QUAD 36+ mos IM; Standing - Flu Vaccine QUAD 36+ mos IM  3.  Burning with urination Send U culture  Preterm labor symptoms and general obstetric precautions including but not limited to vaginal bleeding, contractions, leaking of fluid and fetal movement were reviewed in detail with the patient. Please refer to After Visit Summary for other counseling recommendations.  Return in about 2 weeks (around 07/03/2015).   Guss Bunde, MD

## 2015-06-19 NOTE — Patient Instructions (Signed)
Levonorgestrel intrauterine device (IUD) Qu es este medicamento? El LEVONORGESTREL (DIU) es un dispositivo anticonceptivo (control de natalidad). El dispositivo se coloca dentro del tero por un profesional de la salud. Se utiliza para Therapist, occupational y tambin se puede Risk manager para tratar el sangrado abundante que ocurre durante su perodo. Dependiendo del dispositivo, se puede utilizar por 3 a 5 aos. Este medicamento puede ser utilizado para otros usos; si tiene alguna pregunta consulte con su proveedor de atencin mdica o con su farmacutico. Qu le debo informar a mi profesional de la salud antes de tomar este medicamento? Necesita saber si usted presenta alguno de los siguientes problemas o situaciones: -exmen de Papanicolaou anormal -cncer de mama, cuello del tero o tero -diabetes -endometritis -si tiene una infeccin plvica o genital actual o en el pasado -tiene ms de una pareja sexual o si su pareja tiene ms de una pareja -enfermedad cardiaca -antecedente de embarazo tubrico o ectpico -problemas del sistema inmunolgico -DIU colocado -enfermedad heptica o tumor del hgado -problemas con la coagulacin o si toma diluyentes sanguneos -[place] medicamentos intravenoso -forma inusual del tero -sangrado vaginal que no tiene explicacin -una reaccin alrgica o inusual al levonorgestrel, a otras hormonas, a la silicona o polietilenos, a otros medicamentos, alimentos, colorantes o conservantes -si est embarazada o buscando quedar embarazada -si est amamantando a un beb Cmo debo utilizar este medicamento? Un profesional de Estate agent este dispositivo en el tero. Hable con su pediatra para informarse acerca del uso de este medicamento en nios. Puede requerir atencin especial. Sobredosis: Pngase en contacto inmediatamente con un centro toxicolgico o una sala de urgencia si usted cree que haya tomado demasiado medicamento. ATENCIN: [employer] es solo  para usted. No comparta este medicamento con nadie. Qu sucede si me olvido de una dosis? No se aplica en este caso. Qu puede interactuar con este medicamento? No tome esta medicina con ninguno de los siguientes medicamentos: -amprenavir -bosentano -fosamprenavir Esta medicina tambin puede interactuar con los siguientes medicamentos: -aprepitant -barbitricos para producir el sueo o para el tratamiento de convulsiones -bexaroteno -griseofulvina -medicamentos para tratar los convulsiones, tales como [place], [place], [place], [place], [place], topiramato -modafinilo -pioglitazona -rifabutina -rifampicina -rifapentina -algunos medicamentos para tratar el virus VIH, tales como atazanavir, indinavir, lopinavir, nelfinavir, tipranavir, ritonavir -hierba de [place] -warfarina Puede ser que esta lista no menciona todas las posibles interacciones. Informe a su profesional de [employer]	[place] de [employer] productos a base de hierbas, medicamentos de [place] o suplementos nutritivos que est tomando. Si usted fuma, consume bebidas alcohlicas o si utiliza drogas ilegales, indqueselo tambin a su profesional de [employer]	[place]. Algunas sustancias pueden interactuar con su medicamento. A qu debo estar atento al usar [employer]? Visite a su mdico o a su profesional de la salud para chequear su evolucin peridicamente. Visite a su mdico si usted o su pareja tiene relaciones sexuales con [employer], se vuelve VIH positivo o contrae una enfermedad de transmisin sexual. Este medicamento no la protege de la infeccin por VIH (SIDA) ni de ninguna otra enfermedad de transmisin sexual. Puede controlar la ubicacin del DIU usted misma palpando con sus dedos limpios los hilos en la parte anterior de la vagina. No tire de los hilos. Es un buen hbito controlar la ubicacin del dispositivo despus de cada perodo menstrual. Si no slo siente los hilos sino que adems siente otra parte  ms del DIU o si no puede sentir los hilos, consulte a su mdico inmediatamente. El DIU puede salirse  por s solo. Puede quedar embarazada si el dispositivo se sale de Chief of Staff. Utilice un mtodo anticonceptivo adicional, como preservativos, y consulte a su proveedor de atencin mdica s observa que el DIU se sali de Chief of Staff. La utilizacin de tampones no cambia la posicin del DIU y no hay inconvenientes en usarlos durante su perodo. Qu efectos secundarios puedo tener al [employer] este medicamento? Efectos secundarios que debe informar a su mdico o a Barrister's clerk de la salud tan pronto como sea posible: -Chief of Staff como erupcin cutnea, picazn o urticarias, hinchazn de la cara, labios o lengua -fiebre, sntomas gripales -llagas genitales -alta presin sangunea -ausencia de un perodo menstrual durante 6 semanas mientras lo utiliza -Social research officer, government, Occupational hygienist en las piernas -dolor o sensibilidad del plvico -dolor de cabeza repentino o severo -signos de Media planner -calambres estomacales -falta de aliento repentina -problemas de coordinacin, del habla, al caminar -sangrado, flujo vaginal inusual -color amarillento de los ojos o la piel Efectos secundarios que, por lo general, no requieren atencin mdica (debe informarlos a su mdico o a su profesional de la salud si persisten o si son molestos): -acn -dolor de pecho -cambios en el deseo sexual o capacidad -cambios de peso -calambres, Tree surgeon o sensacin de [employer] se introduce el dispositivo -dolor de cabeza -sangrado menstruales irregulares en los primeros 3 a 6 meses de usar -nuseas Puede ser que esta lista no menciona todos los posibles efectos secundarios. Comunquese a su mdico por asesoramiento mdico [employer]. Usted puede informar los efectos secundarios a la FDA por telfono al 1-800-FDA-1088. Dnde debo guardar mi medicina? No se aplica en este caso. ATENCIN: Este folleto es  un resumen. Puede ser que no cubra toda la posible informacin. Si usted tiene preguntas acerca de esta medicina, consulte con su mdico, su farmacutico o su profesional de Technical sales engineer.    2016, Elsevier/Gold Standard. (2014-06-14 00:00:00) Eleccin del mtodo anticonceptivo (Contraception Choices) La anticoncepcin (control de la natalidad) es el uso de cualquier mtodo o dispositivo para Therapist, occupational. A continuacin se indican algunos de esos mtodos. Reynolds contraconceptivo consiste en un tubo plstico delgado que contiene la hormona progesterona. No contiene estrgenos. El mdico inserta el tubo en la parte interna del brazo. El tubo puede Nutritional therapist durante 3 aos. Despus de los 3 aos debe retirarse. El implante impide que los ovarios liberen vulos (ovulacin), espesa el moco cervical, lo que evita que los espermatozoides ingresen al tero y hace ms delgada la membrana que cubre el interior del tero.  Inyecciones de progesterona sola: las Water engineer cada 3 meses para Therapist, occupational. La progesterona sinttica impide que los ovarios liberen vulos. Tambin hacen que el moco cervical se espese y modifique el tejido de recubrimiento interno del tero. Esto hace ms difcil que los espermatozoides sobrevivan en el tero.  Las pldoras anticonceptivas contienen estrgenos y Immunologist. Su funcin es [employer] ovarios liberen vulos (ovulacin). Las hormonas de los anticonceptivos orales hacen que el moco cervical se haga ms espeso, lo que evita que el esperma ingrese al tero. Las pldoras anticonceptivas son recetadas por el mdico.Tambin se utilizan para tratar los perodos menstruales abundantes.  Minipldora: este tipo de pldora anticonceptiva contiene slo hormona progesterona. Deben tomarse todos los das del mes y debe recetarlas el mdico.  El parche de control de natalidad: contiene hormonas similares a las que contienen  las pldoras anticonceptivas. Deben cambiarse una vez por semana  y se utilizan bajo prescripcin mdica.  Anillo vaginal: contiene hormonas similares a las que contienen las pldoras anticonceptivas. Se deja colocado durante tres semanas, se lo retira durante 1 semana y luego se coloca uno nuevo. La paciente debe sentirse cmoda al insertar y retirar el anillo de la vagina.Es necesaria la prescripcin mdica.  Anticonceptivos de emergencia: son mtodos para evitar un embarazo despus de Ardelia Mems relacin sexual sin proteccin. Esta pldora puede tomarse inmediatamente despus de Office manager sexuales o hasta 5 Zinc de haber tenido sexo sin proteccin. Es ms efectiva si se toma poco tiempo despus de la relacin sexual. Los anticonceptivos de emergencia estn disponibles sin prescripcin mdica. Consltelo con su farmacutico. No use los anticonceptivos de emergencia como nico mtodo anticonceptivo. MTODOS DE [place]   Condn masculino: es una vaina delgada (ltex o goma) que se coloca cubriendo al pene durante el acto sexual. Grier Rocher con espermicida para aumentar la efectividad.  Condn femenino. Es una funda delicada y blanda que se adapta holgadamente a la vagina antes de las Armed forces operational officer.  Diafragma: es una barrera de ltex redonda y suave que debe ser recomendado por un profesional. Se inserta en la vagina, junto con un gel espermicida. Debe insertarse antes de Clinical biochemist. Debe dejar el diafragma colocado en la vagina durante 6 a 8 horas despus de la relacin sexual.  Capuchn cervical: es una barrera de ltex o taza plstica redonda y [place] que cubre el cuello del tero y debe ser colocada por un mdico. Puede dejarlo colocado en la vagina hasta 48 horas despus [place].  Esponja: es una pieza blanda y circular de espuma de poliuretano. Contiene un espermicida. Se inserta en la vagina despus de mojarla y antes de las [employer].  Espermicidas: son sustancias qumicas que matan o bloquean al esperma y no lo dejan ingresar al cuello del tero y al tero. Vienen en forma de cremas, geles, supositorios, espuma o comprimidos. No es necesario tener Furniture conservator/restorer. Se insertan en la vagina con un aplicador antes de Clinical biochemist. El proceso debe repetirse cada vez que tiene relaciones sexuales. ANTICONCEPTIVOS INTRAUTERINOS  Dispositivo intrauterino (DIU) es un dispositivo en forma de T que se coloca en el tero durante el perodo menstrual, para Therapist, occupational. Hay dos tipos:  DIU de cobre: este tipo de DIU est recubierto con un alambre de cobre y se inserta dentro del tero. El cobre hace que el tero y las trompas de Falopio produzcan un liquido que [employer] espermatozoides. Puede permanecer colocado durante 10 aos.  DIU con hormona: este tipo de DIU contiene la hormona progestina (progesterona sinttica). La hormona espesa el moco cervical y evita que los espermatozoides ingresen al tero y tambin afina la membrana que cubre el tero para evitar la implantacin del vulo fertilizado. La hormona debilita o destruye los espermatozoides que ingresan al tero. Puede Nutritional therapist durante 3-5 aos, segn el tipo de DIU que se [place]. MTODOS ANTICONCEPTIVOS PERMANENTES  Ligadura de trompas en la mujer: se realiza sellando, atando u obstruyendo quirrgicamente las trompas de Falopio lo que impide que el vulo descienda hacia el tero.  Esterilizacin histeroscpica: Implica la colocacin de un pequeo espiral o la insercin en cada trompa de Falopio. El mdico utiliza una tcnica llamada histeroscopa para Teacher, music procedimiento. El dispositivo produce la formacin de tejido Pensions consultant. Esto da como resultado una obstruccin permanente de las trompas de Falopio, de modo que la esperma no pueda fertilizar  el vulo. Demora alrededor de 3 meses despus del procedimiento hasta que el conducto  se obstruye. Tendr que usar otro mtodo anticonceptivo durante al menos 3 meses.  Esterilizacin masculina: se realiza ligando los conductos por los que pasan los espermatozoides (vasectoma).Esto impide que el esperma ingrese a la vagina durante el acto sexual. Luego del procedimiento, el hombre puede eyacular lquido (semen). MTODOS DE PLANIFICACIN NATURAL  Planificacin familiar natural: consiste en no Clinical biochemist o usar un mtodo de barrera (condn, [place], capuchn cervical) en los [employer] la mujer podra quedar [place].  Mtodo de calendario: consiste en el seguimiento de la duracin de cada ciclo menstrual y la identificacin de los perodos frtiles.  Mtodo de ovulacin: Dance movement psychotherapist las relaciones sexuales durante la ovulacin.  Mtodo sintotrmico: Energy manager sexuales en la poca en la que se est ovulando, utilizando un termmetro y tendiendo en cuenta los sntomas de la ovulacin.  Mtodo postovulacin: Museum/gallery conservator las relaciones sexuales para despus de haber ovulado. Independientemente del tipo o mtodo anticonceptivo que usted elija, es importante que use condones para protegerse contra las infecciones de transmisin sexual (ETS). Hable con su mdico con respecto a qu mtodo anticonceptivo es el ms apropiado para usted.   Esta informacin no tiene Marine scientist el consejo del mdico. Asegrese de hacerle al mdico cualquier pregunta que tenga.   Document Released: 04/22/2005 Document Revised: 12/23/2012 Elsevier Interactive Patient Education [employer].

## 2015-06-19 NOTE — Progress Notes (Signed)
Patient reports contractions at night, sometimes every 10 minutes Cassandra Stokes used for interpreter Reviewed tip of week with patient

## 2015-06-21 LAB — CULTURE, OB URINE
Colony Count: NO GROWTH
Organism ID, Bacteria: NO GROWTH

## 2015-07-03 ENCOUNTER — Encounter: Payer: Self-pay | Admitting: Obstetrics and Gynecology

## 2015-07-05 ENCOUNTER — Ambulatory Visit (INDEPENDENT_AMBULATORY_CARE_PROVIDER_SITE_OTHER): Payer: Medicaid Other | Admitting: Obstetrics and Gynecology

## 2015-07-05 ENCOUNTER — Ambulatory Visit (HOSPITAL_COMMUNITY)
Admission: RE | Admit: 2015-07-05 | Discharge: 2015-07-05 | Disposition: A | Discharge status: Home / Self Care | Payer: Self-pay | Source: Ambulatory Visit | Attending: Obstetrics and Gynecology | Admitting: Obstetrics and Gynecology

## 2015-07-05 ENCOUNTER — Encounter (HOSPITAL_COMMUNITY): Payer: Self-pay

## 2015-07-05 VITALS — BP 128/86 | HR 98 | Temp 98.9°F | Wt 160.4 lb

## 2015-07-05 DIAGNOSIS — O35HXX0 Maternal care for other (suspected) fetal abnormality and damage, fetal lower extremities anomalies, not applicable or unspecified: Secondary | ICD-10-CM

## 2015-07-05 DIAGNOSIS — O283 Abnormal ultrasonic finding on antenatal screening of mother: Secondary | ICD-10-CM | POA: Insufficient documentation

## 2015-07-05 DIAGNOSIS — O3413 Maternal care for benign tumor of corpus uteri, third trimester: Secondary | ICD-10-CM

## 2015-07-05 DIAGNOSIS — O358XX0 Maternal care for other (suspected) fetal abnormality and damage, not applicable or unspecified: Secondary | ICD-10-CM

## 2015-07-05 DIAGNOSIS — D259 Leiomyoma of uterus, unspecified: Secondary | ICD-10-CM | POA: Insufficient documentation

## 2015-07-05 DIAGNOSIS — O09892 Supervision of other high risk pregnancies, second trimester: Secondary | ICD-10-CM

## 2015-07-05 DIAGNOSIS — Z3A33 33 weeks gestation of pregnancy: Secondary | ICD-10-CM | POA: Insufficient documentation

## 2015-07-05 LAB — HIV ANTIBODY (ROUTINE TESTING W REFLEX): HIV 1&2 Ab, 4th Generation: NONREACTIVE

## 2015-07-05 LAB — POCT URINALYSIS DIP (DEVICE)
Bilirubin Urine: NEGATIVE
Glucose, UA: NEGATIVE mg/dL
HGB URINE DIPSTICK: NEGATIVE
Ketones, ur: NEGATIVE mg/dL
NITRITE: NEGATIVE
PH: 7 (ref 5.0–8.0)
Protein, ur: NEGATIVE mg/dL
Specific Gravity, Urine: 1.015 (ref 1.005–1.030)
UROBILINOGEN UA: 0.2 mg/dL (ref 0.0–1.0)

## 2015-07-05 NOTE — Progress Notes (Signed)
Used Interpreter Nash Dimmer.

## 2015-07-05 NOTE — Progress Notes (Signed)
Subjective:  Cassandra Stokes is a 35 y.o. G1P0 at [redacted]w[redacted]d being seen today for ongoing prenatal care.  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  Patient reports no complaints.  Contractions: Irregular. Vag. Bleeding: None.  Movement: Present. Denies leaking of fluid.   The following portions of the patient's history were reviewed and updated as appropriate: allergies, current medications, past family history, past medical history, past social history, past surgical history and problem list. Problem list updated.  Objective:   Filed Vitals:   07/05/15 0932 07/05/15 0936  BP: 108/93 128/86  Pulse: 98   Temp: 98.9 F (37.2 C)   Weight: 160 lb 6.4 oz (72.757 kg)     Fetal Status: Fetal Heart Rate (bpm): 144   Movement: Present     General:  Alert, oriented and cooperative. Patient is in no acute distress.  Skin: Skin is warm and dry. No rash noted.   Cardiovascular: Normal heart rate noted  Respiratory: Normal respiratory effort, no problems with respiration noted  Abdomen: Soft, gravid, appropriate for gestational age. Pain/Pressure: Present     Pelvic: Vag. Bleeding: None     Cervical exam deferred        Extremities: Normal range of motion.  Edema: None  Mental Status: Normal mood and affect. Normal behavior. Normal judgment and thought content.   Urinalysis: Urine Protein: Negative Urine Glucose: Negative  Assessment and Plan:  Pregnancy: G1P0 at [redacted]w[redacted]d  1. Supervision of other high risk pregnancies, second trimester. Multiple anomalies seen on anatomy  scan - u/s today. Patient declines fetal echo - hiv, rpr  Preterm labor symptoms and general obstetric precautions including but not limited to vaginal bleeding, contractions, leaking of fluid and fetal movement were reviewed in detail with the patient. Please refer to After Visit Summary for other counseling recommendations.  F/u 4 wks  Gwynne Edinger, MD

## 2015-07-06 LAB — RPR

## 2015-07-20 ENCOUNTER — Ambulatory Visit (INDEPENDENT_AMBULATORY_CARE_PROVIDER_SITE_OTHER): Payer: Self-pay | Admitting: Family

## 2015-07-20 VITALS — BP 132/92 | HR 79 | Temp 98.3°F | Wt 163.4 lb

## 2015-07-20 DIAGNOSIS — O3410 Maternal care for benign tumor of corpus uteri, unspecified trimester: Secondary | ICD-10-CM

## 2015-07-20 DIAGNOSIS — D259 Leiomyoma of uterus, unspecified: Secondary | ICD-10-CM

## 2015-07-20 DIAGNOSIS — O139 Gestational [pregnancy-induced] hypertension without significant proteinuria, unspecified trimester: Secondary | ICD-10-CM | POA: Insufficient documentation

## 2015-07-20 DIAGNOSIS — O133 Gestational [pregnancy-induced] hypertension without significant proteinuria, third trimester: Secondary | ICD-10-CM

## 2015-07-20 DIAGNOSIS — O3413 Maternal care for benign tumor of corpus uteri, third trimester: Secondary | ICD-10-CM

## 2015-07-20 DIAGNOSIS — O09892 Supervision of other high risk pregnancies, second trimester: Secondary | ICD-10-CM

## 2015-07-20 LAB — CBC
HEMATOCRIT: 37.5 % (ref 36.0–46.0)
Hemoglobin: 12.6 g/dL (ref 12.0–15.0)
MCH: 30 pg (ref 26.0–34.0)
MCHC: 33.6 g/dL (ref 30.0–36.0)
MCV: 89.3 fL (ref 78.0–100.0)
MPV: 10.4 fL (ref 8.6–12.4)
Platelets: 283 10*3/uL (ref 150–400)
RBC: 4.2 MIL/uL (ref 3.87–5.11)
RDW: 14.2 % (ref 11.5–15.5)
WBC: 8.4 10*3/uL (ref 4.0–10.5)

## 2015-07-20 LAB — COMPREHENSIVE METABOLIC PANEL
ALK PHOS: 110 U/L (ref 33–115)
ALT: 14 U/L (ref 6–29)
AST: 21 U/L (ref 10–30)
Albumin: 3.4 g/dL — ABNORMAL LOW (ref 3.6–5.1)
BILIRUBIN TOTAL: 0.3 mg/dL (ref 0.2–1.2)
BUN: 6 mg/dL — AB (ref 7–25)
CALCIUM: 9.1 mg/dL (ref 8.6–10.2)
CO2: 17 mmol/L — ABNORMAL LOW (ref 20–31)
Chloride: 102 mmol/L (ref 98–110)
Creat: 0.4 mg/dL — ABNORMAL LOW (ref 0.50–1.10)
Glucose, Bld: 88 mg/dL (ref 65–99)
POTASSIUM: 3.8 mmol/L (ref 3.5–5.3)
Sodium: 137 mmol/L (ref 135–146)
TOTAL PROTEIN: 6.5 g/dL (ref 6.1–8.1)

## 2015-07-20 LAB — POCT URINALYSIS DIP (DEVICE)
BILIRUBIN URINE: NEGATIVE
GLUCOSE, UA: NEGATIVE mg/dL
KETONES UR: 15 mg/dL — AB
LEUKOCYTES UA: NEGATIVE
Nitrite: NEGATIVE
Protein, ur: 30 mg/dL — AB
SPECIFIC GRAVITY, URINE: 1.02 (ref 1.005–1.030)
Urobilinogen, UA: 0.2 mg/dL (ref 0.0–1.0)
pH: 7 (ref 5.0–8.0)

## 2015-07-20 NOTE — Patient Instructions (Signed)

## 2015-07-20 NOTE — Progress Notes (Signed)
Pt states that she cannot come to hospital for twice weekly appts as recommended for fetal testing.  She agrees to weekly appts for prenatal visit, NST and BPP.

## 2015-07-20 NOTE — Progress Notes (Signed)
Subjective:  Cassandra Stokes is a 35 y.o. G1P0 at 64w6dbeing seen today for ongoing prenatal care.  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  Patient reports twitching on scalp every now and then.  None today.  Denies  headache, vision changes, or epigastric pain.  Contractions: Irregular.  .  Movement: Present. Denies leaking of fluid.   The following portions of the patient's history were reviewed and updated as appropriate: allergies, current medications, past family history, past medical history, past social history, past surgical history and problem list. Problem list updated.  Objective:   Filed Vitals:   07/20/15 1033  BP: 132/92  Pulse: 79  Temp: 98.3 F (36.8 C)  Weight: 163 lb 6.4 oz (74.118 kg)    Fetal Status: Fetal Heart Rate (bpm): 149 Fundal Height: 36 cm Movement: Present     General:  Alert, oriented and cooperative. Patient is in no acute distress.  Skin: Skin is warm and dry. No rash noted.   Cardiovascular: Normal heart rate noted  Respiratory: Normal respiratory effort, no problems with respiration noted  Abdomen: Soft, gravid, appropriate for gestational age. Pain/Pressure: Present     Pelvic:       Cervical exam deferred        Extremities: Normal range of motion.  Edema: Trace  Mental Status: Normal mood and affect. Normal behavior. Normal judgment and thought content.   Urinalysis: Urine Protein: 1+ Urine Glucose: Negative  Assessment and Plan:  Pregnancy: G1P0 at 323w6d1. Supervision of  other high risk pregnancies, second trimester - CBC - Comp Met (CMET) - Protein / creatinine ratio, urine - Fetal nonstress test  2. Gestational hypertension, antepartum - Begin fetal testing today - IOL at 37 weeks  3. Uterine fibroid in pregnancy - Continue observation  Preterm labor symptoms and general obstetric precautions including but not limited to vaginal bleeding, contractions, leaking of fluid and fetal movement were reviewed in detail with the patient. Please refer to After Visit Summary for other counseling recommendations.  Return in about 1 week (around 07/27/2015).  All information obtained and conveyed via video conferencing.  WaVenia Carbon Michiel CowboyCNM

## 2015-07-21 LAB — PROTEIN / CREATININE RATIO, URINE
Creatinine, Urine: 121 mg/dL (ref 20–320)
PROTEIN CREATININE RATIO: 339 mg/g{creat} — AB (ref 21–161)
TOTAL PROTEIN, URINE: 41 mg/dL — AB (ref 5–24)

## 2015-07-25 ENCOUNTER — Ambulatory Visit (HOSPITAL_COMMUNITY): Admission: RE | Admit: 2015-07-25 | Payer: Self-pay | Source: Ambulatory Visit

## 2015-07-25 ENCOUNTER — Telehealth (HOSPITAL_COMMUNITY): Payer: Self-pay | Admitting: *Deleted

## 2015-07-25 ENCOUNTER — Ambulatory Visit (INDEPENDENT_AMBULATORY_CARE_PROVIDER_SITE_OTHER): Payer: Self-pay | Admitting: Certified Nurse Midwife

## 2015-07-25 ENCOUNTER — Ambulatory Visit (HOSPITAL_COMMUNITY)
Admission: AD | Admit: 2015-07-25 | Discharge: 2015-07-25 | Disposition: A | Discharge status: Home / Self Care | Payer: Self-pay | Source: Ambulatory Visit | Attending: Maternal and Fetal Medicine | Admitting: Maternal and Fetal Medicine

## 2015-07-25 VITALS — BP 131/82 | HR 105 | Temp 98.3°F | Resp 18 | Wt 161.2 lb

## 2015-07-25 DIAGNOSIS — Z113 Encounter for screening for infections with a predominantly sexual mode of transmission: Secondary | ICD-10-CM

## 2015-07-25 DIAGNOSIS — O139 Gestational [pregnancy-induced] hypertension without significant proteinuria, unspecified trimester: Secondary | ICD-10-CM

## 2015-07-25 DIAGNOSIS — D259 Leiomyoma of uterus, unspecified: Secondary | ICD-10-CM

## 2015-07-25 DIAGNOSIS — O3410 Maternal care for benign tumor of corpus uteri, unspecified trimester: Secondary | ICD-10-CM

## 2015-07-25 DIAGNOSIS — O0993 Supervision of high risk pregnancy, unspecified, third trimester: Secondary | ICD-10-CM

## 2015-07-25 DIAGNOSIS — O35AXX0 Maternal care for other (suspected) fetal abnormality and damage, fetal facial anomalies, not applicable or unspecified: Secondary | ICD-10-CM

## 2015-07-25 DIAGNOSIS — O35HXX0 Maternal care for other (suspected) fetal abnormality and damage, fetal lower extremities anomalies, not applicable or unspecified: Secondary | ICD-10-CM

## 2015-07-25 DIAGNOSIS — O133 Gestational [pregnancy-induced] hypertension without significant proteinuria, third trimester: Secondary | ICD-10-CM

## 2015-07-25 DIAGNOSIS — O2690 Pregnancy related conditions, unspecified, unspecified trimester: Secondary | ICD-10-CM | POA: Insufficient documentation

## 2015-07-25 DIAGNOSIS — Z3A00 Weeks of gestation of pregnancy not specified: Secondary | ICD-10-CM | POA: Insufficient documentation

## 2015-07-25 DIAGNOSIS — O358XX0 Maternal care for other (suspected) fetal abnormality and damage, not applicable or unspecified: Secondary | ICD-10-CM

## 2015-07-25 LAB — POCT URINALYSIS DIP (DEVICE)
Glucose, UA: NEGATIVE mg/dL
KETONES UR: 40 mg/dL — AB
NITRITE: NEGATIVE
Protein, ur: 100 mg/dL — AB
Specific Gravity, Urine: 1.025 (ref 1.005–1.030)
Urobilinogen, UA: 0.2 mg/dL (ref 0.0–1.0)
pH: 6.5 (ref 5.0–8.0)

## 2015-07-25 NOTE — Telephone Encounter (Signed)
Preadmission screen Interpreter number [phone]

## 2015-07-25 NOTE — Progress Notes (Signed)
Pt reports having vaginal bleeding and much abdominal pain yesterday as well as today. She was not able to sleep due to the pain. Moderate blood on udip today. [place] for BPP today @ 0930 - pt is only able to attend weekly appts for fetal testing. IOL scheduled 3/24 @ 0630.

## 2015-07-25 NOTE — Progress Notes (Signed)
Subjective:  Cassandra Stokes is a 35 y.o. G1P0 at [redacted]w[redacted]d being seen today for ongoing prenatal care.  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  Patient reports contractions since last night. pt is very uncomfortable with contractions and having bloody show.  Contractions: Irregular. Vag. Bleeding: Moderate.  Movement: Present. Denies leaking of fluid.   The following portions of the patient's history were reviewed and updated as appropriate: allergies, current medications, past family history, past medical history, past social history, past surgical history and problem list. Problem list updated.  Objective:   Filed Vitals:   07/25/15 0819  BP: 131/82  Pulse: 105  Weight: 161 lb 3.2 oz (73.12 kg)    Fetal Status: Fetal Heart Rate (bpm): nst-R   Movement: Present     General:  Alert, oriented and cooperative. Patient is in no acute distress.  Skin: Skin is warm and dry. No rash noted.   Cardiovascular: Normal heart rate noted  Respiratory: Normal respiratory effort, no problems with respiration noted  Abdomen: Soft, gravid, appropriate for gestational age. Pain/Pressure: Present     Pelvic: Vag. Bleeding: Moderate     Cervical exam performed Dilation: 3.5 Effacement (%): 100 Station: -1  Extremities: Normal range of motion.  Edema: None  Mental Status: Normal mood and affect. Normal behavior. Normal judgment and thought content.   Urinalysis: Urine Protein: 1+ Urine Glucose: Negative  Assessment and Plan:  Pregnancy: G1P0 at  [redacted]w[redacted]d  1. Gestational hypertension, antepartum  2. Supervision of high risk pregnancy in third trimester Pt in possible labor and called Dr. Ernestina Patches. Dr Roselie Awkward aware. Pt may need to be transferred to another facitlity due to NICU being closed and multiple anomalies  3. Club foot, fetal, affecting care of mother, antepartum, not applicable or unspecified fetus   4. 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 Uterine fibroid in pregnancy   Preterm labor symptoms and general obstetric precautions including but not limited to vaginal bleeding, contractions, leaking of fluid and fetal movement were reviewed in detail with the patient. Please refer to After Visit Summary for other counseling recommendations.  No Follow-up on file.   Larey Days, CNM

## 2015-07-25 NOTE — Progress Notes (Signed)
Report given to, Atilano Median L&D charge nurse @ [phone].

## 2015-07-25 NOTE — Patient Instructions (Signed)
(Group B Streptococcus Infection During Pregnancy) El estreptococo del grupo B (GBS, por sus siglas en ingls) es un tipo de bacteria que se encuentra en las mujeres sanas. No es el mismo que la bacteria que causa faringitis estreptoccica. Puede tener estreptococo del grupoB en la vagina, el recto o la vejiga. El estreptococo del grupoB no se transmite por contacto sexual; sin embargo, un beb puede contagiarse durante el parto, lo que puede ser peligroso para Animal nutritionist. El estreptococo del grupoB no es peligroso para usted y, generalmente, no provoca sntomas. El mdico puede hacerle anlisis de deteccin del estreptococo del grupoB entre la semana 55 y la 37de Media planner. Esta bacteria es peligrosa solamente durante el parto, de modo que no es necesario hacer pruebas de deteccin con anterioridad. Es posible Acupuncturist del grupoB durante el embarazo y no transmitrselo nunca al beb. Si los resultados de los anlisis de deteccin del estreptococo del grupoB son positivos, el mdico puede recomendar que se le administren antibiticos durante el parto para asegurarse de que el beb est sano. FACTORES DE RIESGO Tiene ms probabilidades de transmitirle el estreptococo del grupoB al beb si:   Se le rompe la bolsa de aguas (ruptura de [place]) o si el trabajo de parto comienza antes de las 37semanas.  Se le rompe la bolsa 18horas antes del parto.  Transmiti el estreptococo del grupoB en un embarazo anterior.  Tiene una infeccin de las vas urinarias causada por el estreptococo del grupoB en cualquier momento durante el embarazo.  Tiene fiebre durante el [place]. SNTOMAS La mayora de las mujeres que tienen estreptococo del grupoB no presentan sntomas. Si tiene una infeccin de las vas urinarias causada por el estreptococo del grupoB, es posible que orine con frecuencia o que tenga dolor al Garment/textile technologist y [place]. Generalmente, los bebs que tienen estreptococo del grupoB  presentan sntomas en el trmino de los 7das posteriores al nacimiento. Los sntomas pueden incluir:   Problemas respiratorios.  Problemas cardacos y de presin arterial.  Problemas digestivos y renales. DIAGNSTICO Se recomienda que todas las embarazadas se hagan exmenes de rutina para la deteccin del estreptococo del grupoB. El mdico toma [place] de secrecin de la vagina y el recto con un hisopo, que luego se enva al laboratorio para Product manager presencia de estreptococo del grupoB. Tambin pueden tomarle una muestra de orina para controlar si hay bacterias.  TRATAMIENTO Si el resultado de los [employer] de deteccin del estreptococo del grupoB es positivo, tal vez deba recibir tratamiento con un antibitico durante el [place]. En cuanto comience el trabajo de parto o se produzca la ruptura de las [place], recibir el antibitico a travs de una va [place]. Seguir recibiendo [employer] despus del [place]. No necesita antibiticos si el parto es por cesrea. Si el beb muestra signos o sntomas de estreptococo del grupoB despus del parto, tambin puede recibir tratamiento con antibiticos. [place] todos los medicamentos tal como el [employer]. Tome los medicamentos solamente como se lo hayan indicado.  Contine con las visitas y cuidados prenatales.  Cumpla con todas las visitas de control. SOLICITE ATENCIN MDICA SI:   Siente dolor al [employer].  Tiene necesidad de orinar con frecuencia.  Tiene fiebre. SOLICITE ATENCIN MDICA DE INMEDIATO SI:   Se produce la ruptura de las [place].  Comienza el trabajo de [place].   Esta informacin no tiene Marine scientist el consejo del mdico. Chief Strategy Officer  de hacerle al mdico cualquier pregunta que tenga.   Document Released: 10/09/2007 Document Revised: 04/27/2013 Elsevier Interactive Patient Education [employer].

## 2015-07-26 LAB — GC/CHLAMYDIA PROBE AMP (~~LOC~~) NOT AT ARMC
Chlamydia: NEGATIVE
Neisseria Gonorrhea: NEGATIVE

## 2015-07-28 ENCOUNTER — Inpatient Hospital Stay (HOSPITAL_COMMUNITY): Admission: RE | Admit: 2015-07-28 | Payer: Self-pay | Source: Ambulatory Visit

## 2016-02-16 ENCOUNTER — Encounter (HOSPITAL_COMMUNITY): Payer: Self-pay

## 2019-11-04 ENCOUNTER — Encounter: Payer: Self-pay | Admitting: *Deleted

## 2019-11-04 ENCOUNTER — Encounter: Payer: Self-pay | Admitting: Family Medicine

## 2019-11-04 ENCOUNTER — Ambulatory Visit (INDEPENDENT_AMBULATORY_CARE_PROVIDER_SITE_OTHER): Payer: Self-pay | Admitting: *Deleted

## 2019-11-04 ENCOUNTER — Other Ambulatory Visit: Payer: Self-pay

## 2019-11-04 VITALS — BP 142/99 | HR 70

## 2019-11-04 DIAGNOSIS — O99891 Other specified diseases and conditions complicating pregnancy: Secondary | ICD-10-CM

## 2019-11-04 DIAGNOSIS — R030 Elevated blood-pressure reading, without diagnosis of hypertension: Secondary | ICD-10-CM

## 2019-11-04 DIAGNOSIS — Z3201 Encounter for pregnancy test, result positive: Secondary | ICD-10-CM

## 2019-11-04 DIAGNOSIS — Z8759 Personal history of other complications of pregnancy, childbirth and the puerperium: Secondary | ICD-10-CM

## 2019-11-04 DIAGNOSIS — Z3200 Encounter for pregnancy test, result unknown: Secondary | ICD-10-CM

## 2019-11-04 LAB — POCT PREGNANCY, URINE: Preg Test, Ur: POSITIVE — AB

## 2019-11-04 NOTE — Progress Notes (Signed)
Interpreter Eda Royal present for encounter. Pt reports approximate LMP (within 2 days) of 09/16/19 which would yield EDD 06/22/20, now [redacted]w[redacted]d. Pt has elevated BP today and states that she had high BP during her pregnancy in 2017. She was not able to give details of any follow up after delivery for this medical problem. She was advised we will schedule prenatal care. Pt has history of infant with multiple anomalies (club foot, cleft lip and palate). She had many questions regarding folic acid and prenatal vitamins which were answered. Observation made by this RN that pt has some difficulty comprehending information and instructions. For this reason it will be best to have in-person visits with a live interpreter present. Total time with pt and interpreter was 30 minutes.

## 2019-11-09 NOTE — Progress Notes (Signed)
I have reviewed this chart and agree with the RN/CMA assessment and management.    K. Meryl Akio Hudnall, M.D. Attending [facility] ([facility] Practice)   

## 2019-11-11 ENCOUNTER — Telehealth: Payer: Self-pay | Admitting: Family Medicine

## 2019-11-11 NOTE — Telephone Encounter (Signed)
I called Cassandra Stokes with Interpreter [place] and she states she is taking a Prenatal vitamin with 25 mg Zinc and was told she should not be taking that much zinc.  I discussed with Cassandra Saupe, NP and informed her that zinc 25 mg is fine in her prenatal vitamins and to continue taking them. She stats she got her first covid vaccine and then found out she is pregnant and is scheduled to get her second covid vaccine and wants to know if she should. I informed her covid vaccine is recommended in pregnancy. She voices understanding. Obie Silos,RN

## 2019-11-11 NOTE — Telephone Encounter (Signed)
Spoke with patient with Spanish interpreter ID# [phone]. Patient stated that she would like to speak to someone about some prenatal vitamins that she has that she is skeptical about taking. Patient instructed that a message will be sent to the nurses and they will contact you as soon as they can. Patient verbalized understanding and message sent to clinical pool.

## 2019-12-28 ENCOUNTER — Other Ambulatory Visit (HOSPITAL_COMMUNITY)
Admission: RE | Admit: 2019-12-28 | Discharge: 2019-12-28 | Disposition: A | Discharge status: Home / Self Care | Payer: Self-pay | Source: Ambulatory Visit | Attending: Obstetrics and Gynecology | Admitting: Obstetrics and Gynecology

## 2019-12-28 ENCOUNTER — Encounter: Payer: Self-pay | Admitting: Obstetrics and Gynecology

## 2019-12-28 ENCOUNTER — Other Ambulatory Visit: Payer: Self-pay

## 2019-12-28 ENCOUNTER — Ambulatory Visit (INDEPENDENT_AMBULATORY_CARE_PROVIDER_SITE_OTHER): Payer: Self-pay | Admitting: Obstetrics and Gynecology

## 2019-12-28 VITALS — BP 135/83 | HR 87 | Wt 157.0 lb

## 2019-12-28 DIAGNOSIS — O0990 Supervision of high risk pregnancy, unspecified, unspecified trimester: Secondary | ICD-10-CM

## 2019-12-28 DIAGNOSIS — O09299 Supervision of pregnancy with other poor reproductive or obstetric history, unspecified trimester: Secondary | ICD-10-CM | POA: Insufficient documentation

## 2019-12-28 DIAGNOSIS — O0930 Supervision of pregnancy with insufficient antenatal care, unspecified trimester: Secondary | ICD-10-CM

## 2019-12-28 DIAGNOSIS — O09522 Supervision of elderly multigravida, second trimester: Secondary | ICD-10-CM

## 2019-12-28 DIAGNOSIS — Z8751 Personal history of pre-term labor: Secondary | ICD-10-CM | POA: Insufficient documentation

## 2019-12-28 DIAGNOSIS — R7989 Other specified abnormal findings of blood chemistry: Secondary | ICD-10-CM

## 2019-12-28 DIAGNOSIS — Z8759 Personal history of other complications of pregnancy, childbirth and the puerperium: Secondary | ICD-10-CM

## 2019-12-28 DIAGNOSIS — Z789 Other specified health status: Secondary | ICD-10-CM

## 2019-12-28 DIAGNOSIS — Z98891 History of uterine scar from previous surgery: Secondary | ICD-10-CM

## 2019-12-28 DIAGNOSIS — O3442 Maternal care for other abnormalities of cervix, second trimester: Secondary | ICD-10-CM

## 2019-12-28 DIAGNOSIS — R87612 Low grade squamous intraepithelial lesion on cytologic smear of cervix (LGSIL): Secondary | ICD-10-CM

## 2019-12-28 DIAGNOSIS — O34219 Maternal care for unspecified type scar from previous cesarean delivery: Secondary | ICD-10-CM | POA: Insufficient documentation

## 2019-12-28 DIAGNOSIS — O09523 Supervision of elderly multigravida, third trimester: Secondary | ICD-10-CM | POA: Insufficient documentation

## 2019-12-28 HISTORY — DX: Supervision of pregnancy with other poor reproductive or obstetric history, unspecified trimester: O09.299

## 2019-12-28 HISTORY — DX: Personal history of other complications of pregnancy, childbirth and the puerperium: Z87.59

## 2019-12-28 MED ORDER — ASPIRIN EC 81 MG PO TBEC: 81.0000 mg | DELAYED_RELEASE_TABLET | Freq: Every day | ORAL | 10 refills | Status: DC

## 2019-12-28 NOTE — Progress Notes (Signed)
New OB Note  12/28/2019   Clinic: [facility]  Chief Complaint: new OB  Transfer of Care Patient: no  History of Present Illness: [name] is a 39 y.o. G2P0101 @ 17/0 weeks ([place] 2/1, based on 17wk bedside u/s today) Patient's last menstrual period was 09/18/2019 (approximate).  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   Any events prior to today's visit: no Her periods were: regular She was using no method when she conceived.  She has Negative signs or symptoms of nausea/vomiting of pregnancy. She has Negative signs or symptoms of miscarriage or preterm labor   ROS: A 12-point review of systems was performed and negative, except as stated in the above HPI.  OBGYN History: As per HPI. OB History  Gravida Para Term Preterm AB Living  2 1   1   1   SAB TAB Ectopic Multiple Live Births          1    # Outcome Date GA Lbr Len/2nd Weight Sex Delivery Anes PTL Lv  2 Current           1 Preterm 07/27/15 [redacted]w[redacted]d   M CS-LTranv   LIV     Complications: Failure to Progress in Second Stage    Any issues with any prior pregnancies: multiple fetal anomalies on ultrasound History of pap smears: Yes. Last pap smear 2016 and results were LSIL   Past Medical History: History reviewed. No pertinent past medical history.  Past Surgical History: Past  Surgical History:  Procedure Laterality Date  . CESAREAN SECTION  2017    Family History:  Family History  Problem Relation Age of Onset  . Vision loss Father     Social History:  Social History   Socioeconomic History  . Marital status: Single    Spouse name: Not on file  . Number of children: Not on file  . Years of education: Not on file  . Highest education level: Not on file  Occupational History  . Not on file  Tobacco Use  . Smoking status: Never Smoker  . Smokeless tobacco: Never Used  Substance and Sexual Activity  . Alcohol use: No  . Drug use: No  . Sexual activity: Yes    Birth control/protection: None  Other Topics Concern  . Not on file  Social History Narrative  . Not on file   Social Determinants of Health   Financial Resource Strain:   . Difficulty of Paying Living Expenses: Not on file  Food Insecurity: No Food Insecurity  . Worried About Charity fundraiser in the Last Year: Never true  . Ran Out of Food in the Last Year: Never true  Transportation Needs: No Transportation Needs  . Lack of Transportation (Medical): No  . Lack of Transportation (Non-Medical): No  Physical Activity:   . Days of Exercise per Week: Not on file  . Minutes of Exercise per Session: Not on file  Stress:   . Feeling of Stress : Not on file  Social  Connections:   . Frequency of Communication with Friends and Family: Not on file  . Frequency of Social Gatherings with Friends and Family: Not on file  . Attends Religious Services: Not on file  . Active Member of Clubs or Organizations: Not on file  . Attends Archivist Meetings: Not on file  . Marital Status: Not on file  Intimate Partner Violence:   . Fear of Current or Ex-Partner: Not on file  . Emotionally Abused: Not on file  . Physically Abused: Not on file  . Sexually Abused: Not on file   Allergy: No Known Allergies  Current Outpatient Medications: Prenatal vitamin  Physical Exam:   BP  135/83   Pulse 87   Wt 157 lb (71.2 kg)   LMP 09/18/2019 (Approximate)   BMI 26.95 kg/m  Body mass index is 26.95 kg/m. Contractions: Not present Vag. Bleeding: None. Fundal height: 18 FHTs: 160s  General appearance: Well nourished, well developed female in no acute distress.  Neck:  Supple, normal appearance, and no thyromegaly  Cardiovascular: S1, S2 normal, no murmur, rub or gallop, regular rate and rhythm Respiratory:  Clear to auscultation bilateral. Normal respiratory effort Abdomen: positive bowel sounds and no masses, hernias; diffusely non tender to palpation, non distended Breasts: patient declines any breast s/s. Neuro/Psych:  Normal mood and affect.  Skin:  Warm and dry.  Lymphatic:  No inguinal lymphadenopathy.   Pelvic exam: is not limited by body habitus EGBUS: within normal limits, Vagina: within normal limits and with no blood in the vault, Cervix: normal appearing cervix without discharge or lesions, closed/long/high, Uterus:  enlarged, c/w 18 week size, and Adnexa:  normal adnexa and no mass, fullness, tenderness  Laboratory: none  Imaging:  Patient states she's had no prior imaging before today Bedside u/s: SLIUP, +FM, subj normal AF, FHR 160s. BPD, HC and FL c/w 17/0 fetus  Assessment: patient doing well  Plan: 1. History of preterm delivery Notes in care everywhere. It looks like she had 36/4 PTL and was also dx with severe pre-eclampsia with pre-existing gHTN in that pregnancy. Since PTL was so late and in the setting of pregnancy with fetal anomalies, I told her I don't recommend any interventions because of that for this pregnancy  2. History of severe pre-eclampsia Baseline labs today. Recommend she start qday low dose asa.   3. Supervision of high risk pregnancy, antepartum Routine care. 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 I also told her I strongly recommend GC visit with MFM and to get an  afp and cffdna today, which she is amenable to.  - CBC/D/Plt+RPR+Rh+ABO+Rub Ab... - Culture, OB Urine - Comprehensive metabolic panel - Protein / creatinine ratio, urine - TSH - Hemoglobin A1c - Cytology - PAP - VITAMIN D 25 Hydroxy (Vit-D Deficiency, Fractures) - [place] MFM OB DETAIL +14 WK; Future - AFP, Serum, Open Spina Bifida  4. Language barrier Interpreter used  5. History of fetal anomaly in prior pregnancy, currently pregnant On u/s in prior pregnancy (see notes in Epic) but multiple fetal anomalies. The patient states that that child is healthy and doing w/o and not having any issues. I asked her if that child needed any surgeries or had any heart issues and she said "no" but I'm not sure if she's downplaying things and that this is truly the case.  39. Multigravida of advanced maternal age in second trimester  7. History of cesarean delivery  pLTCS 2017. Looks like OVD was offered due to worsening category II tracing, but she declined. Can d/w her more later re: delivery mode. C/s c/b hysterotomy extension and ebl 1839mL and need for cysto  Problem list reviewed and updated.  Follow up in 3 weeks.  >50% of 45 min visit spent on counseling and coordination of care.     Durene Romans MD Attending [facility])

## 2019-12-29 ENCOUNTER — Telehealth: Payer: Self-pay | Admitting: *Deleted

## 2019-12-29 ENCOUNTER — Encounter: Payer: Self-pay | Admitting: Obstetrics and Gynecology

## 2019-12-29 DIAGNOSIS — R7989 Other specified abnormal findings of blood chemistry: Secondary | ICD-10-CM | POA: Insufficient documentation

## 2019-12-29 LAB — CBC/D/PLT+RPR+RH+ABO+RUB AB...
Antibody Screen: NEGATIVE
Basophils Absolute: 0 10*3/uL (ref 0.0–0.2)
Basos: 0 %
EOS (ABSOLUTE): 0 10*3/uL (ref 0.0–0.4)
Eos: 0 %
HCV Ab: 0.2 s/co ratio (ref 0.0–0.9)
HIV Screen 4th Generation wRfx: NONREACTIVE
Hematocrit: 37.1 % (ref 34.0–46.6)
Hemoglobin: 12.7 g/dL (ref 11.1–15.9)
Hepatitis B Surface Ag: NEGATIVE
Immature Grans (Abs): 0 10*3/uL (ref 0.0–0.1)
Immature Granulocytes: 0 %
Lymphocytes Absolute: 2.3 10*3/uL (ref 0.7–3.1)
Lymphs: 28 %
MCH: 30.7 pg (ref 26.6–33.0)
MCHC: 34.2 g/dL (ref 31.5–35.7)
MCV: 90 fL (ref 79–97)
Monocytes Absolute: 0.6 10*3/uL (ref 0.1–0.9)
Monocytes: 7 %
Neutrophils Absolute: 5.3 10*3/uL (ref 1.4–7.0)
Neutrophils: 65 %
Platelets: 284 10*3/uL (ref 150–450)
RBC: 4.14 x10E6/uL (ref 3.77–5.28)
RDW: 13.2 % (ref 11.7–15.4)
RPR Ser Ql: NONREACTIVE
Rh Factor: POSITIVE
Rubella Antibodies, IGG: 2.07 index (ref >0.99)
WBC: 8.3 10*3/uL (ref 3.4–10.8)

## 2019-12-29 LAB — COMPREHENSIVE METABOLIC PANEL
ALT: 13 IU/L (ref 0–32)
AST: 16 IU/L (ref 0–40)
Albumin/Globulin Ratio: 1.5 (ref 1.2–2.2)
Albumin: 4.3 g/dL (ref 3.8–4.8)
Alkaline Phosphatase: 59 IU/L (ref 48–121)
BUN/Creatinine Ratio: 9 (ref 9–23)
BUN: 4 mg/dL — ABNORMAL LOW (ref 6–20)
Bilirubin Total: <0.2 mg/dL (ref 0.0–1.2)
CO2: 21 mmol/L (ref 20–29)
Calcium: 9.9 mg/dL (ref 8.7–10.2)
Chloride: 105 mmol/L (ref 96–106)
Creatinine, Ser: 0.43 mg/dL — ABNORMAL LOW (ref 0.57–1.00)
GFR calc Af Amer: 149 mL/min/{1.73_m2} (ref >59)
GFR calc non Af Amer: 129 mL/min/{1.73_m2} (ref >59)
Globulin, Total: 2.8 g/dL (ref 1.5–4.5)
Glucose: 113 mg/dL — ABNORMAL HIGH (ref 65–99)
Potassium: 3.5 mmol/L (ref 3.5–5.2)
Sodium: 139 mmol/L (ref 134–144)
Total Protein: 7.1 g/dL (ref 6.0–8.5)

## 2019-12-29 LAB — AFP, SERUM, OPEN SPINA BIFIDA
AFP MoM: 1.54
AFP Value: 58.7 ng/mL
Gest. Age on Collection Date: 17 weeks
Maternal Age At EDD: 39 yr
OSBR Risk 1 IN: 2457
Test Results:: NEGATIVE
Weight: 157 [lb_av]

## 2019-12-29 LAB — PROTEIN / CREATININE RATIO, URINE
Creatinine, Urine: 27.4 mg/dL
Protein, Ur: 5.8 mg/dL
Protein/Creat Ratio: 212 mg/g creat — ABNORMAL HIGH (ref 0–200)

## 2019-12-29 LAB — HCV INTERPRETATION

## 2019-12-29 LAB — VITAMIN D 25 HYDROXY (VIT D DEFICIENCY, FRACTURES): Vit D, 25-Hydroxy: 23.7 ng/mL — ABNORMAL LOW (ref 30.0–100.0)

## 2019-12-29 LAB — TSH: TSH: 0.848 u[IU]/mL (ref 0.450–4.500)

## 2019-12-29 LAB — HEMOGLOBIN A1C
Est. average glucose Bld gHb Est-mCnc: 117 mg/dL
Hgb A1c MFr Bld: 5.7 % — ABNORMAL HIGH (ref 4.8–5.6)

## 2019-12-29 MED ORDER — CHOLECALCIFEROL 250 MCG (10000 UT) PO TABS: 1.0000 | ORAL_TABLET | Freq: Every day | ORAL | 1 refills | Status: DC

## 2019-12-29 NOTE — Addendum Note (Signed)
Addended by: Aletha Halim on: 12/29/2019 08:25 AM   Modules accepted: Orders

## 2019-12-29 NOTE — Telephone Encounter (Signed)
-----   Message from Aletha Halim, MD sent at 12/29/2019  8:23 AM EDT ----- Can you let her know that she needs an early GTT b/c her a1c is 5.7? Also, can you let her know that her vitamin d is low and I've sent in supplementation for her? thanks

## 2019-12-29 NOTE — Telephone Encounter (Signed)
Pt called using interpreter due to having questions bout taking the baby aspirin and if it was safe to take. Advised pt that it is strongly recommended and is completely  safe for her and the baby. While I had patient on the phone I discussed with her her lab results and recommendations from Dr Ilda Basset. Pt verbalizes and understands and appointment made for early gtt.

## 2019-12-30 LAB — CYTOLOGY - PAP
Chlamydia: NEGATIVE
Comment: NEGATIVE
Comment: NEGATIVE
Comment: NORMAL
Diagnosis: NEGATIVE
High risk HPV: NEGATIVE
Neisseria Gonorrhea: NEGATIVE

## 2019-12-30 LAB — CULTURE, OB URINE

## 2019-12-30 LAB — URINE CULTURE, OB REFLEX

## 2020-01-05 ENCOUNTER — Other Ambulatory Visit: Payer: Self-pay

## 2020-01-05 ENCOUNTER — Encounter: Payer: Self-pay | Admitting: Radiology

## 2020-01-06 ENCOUNTER — Other Ambulatory Visit: Payer: Self-pay

## 2020-01-06 DIAGNOSIS — Z3A18 18 weeks gestation of pregnancy: Secondary | ICD-10-CM

## 2020-01-06 DIAGNOSIS — O0990 Supervision of high risk pregnancy, unspecified, unspecified trimester: Secondary | ICD-10-CM

## 2020-01-06 NOTE — Progress Notes (Signed)
Pt informed of her Panor[place] results via video interpreter.

## 2020-01-07 ENCOUNTER — Other Ambulatory Visit: Payer: Self-pay

## 2020-01-07 LAB — GLUCOSE TOLERANCE, 1 HOUR: Glucose, 1Hr PP: 172 mg/dL (ref 65–199)

## 2020-01-07 NOTE — Progress Notes (Signed)
1 hr GTT 172.  Needs 3 hr GTT for diagnosis, likely has Type II DM given elevated value so early in pregnancy   Please schedule for 3 hr GTT. If unable to do 3 hr GTT, can do 2 hr GTT for diagnosis.  Please call to inform patient of results and recommendations.  Verita Schneiders, MD

## 2020-01-13 ENCOUNTER — Other Ambulatory Visit: Payer: Self-pay

## 2020-01-13 ENCOUNTER — Ambulatory Visit: Payer: Self-pay | Admitting: *Deleted

## 2020-01-13 ENCOUNTER — Other Ambulatory Visit: Payer: Self-pay | Admitting: *Deleted

## 2020-01-13 ENCOUNTER — Ambulatory Visit: Payer: Self-pay | Attending: Obstetrics and Gynecology

## 2020-01-13 ENCOUNTER — Encounter: Payer: Self-pay | Admitting: Obstetrics and Gynecology

## 2020-01-13 ENCOUNTER — Ambulatory Visit: Payer: Self-pay

## 2020-01-13 VITALS — BP 155/90 | HR 87

## 2020-01-13 DIAGNOSIS — O4440 Low lying placenta NOS or without hemorrhage, unspecified trimester: Secondary | ICD-10-CM | POA: Insufficient documentation

## 2020-01-13 DIAGNOSIS — O09529 Supervision of elderly multigravida, unspecified trimester: Secondary | ICD-10-CM

## 2020-01-13 DIAGNOSIS — O283 Abnormal ultrasonic finding on antenatal screening of mother: Secondary | ICD-10-CM | POA: Insufficient documentation

## 2020-01-13 DIAGNOSIS — O0990 Supervision of high risk pregnancy, unspecified, unspecified trimester: Secondary | ICD-10-CM | POA: Insufficient documentation

## 2020-01-18 ENCOUNTER — Telehealth: Payer: Self-pay | Admitting: *Deleted

## 2020-01-18 ENCOUNTER — Encounter: Payer: Self-pay | Admitting: Obstetrics & Gynecology

## 2020-01-18 NOTE — Telephone Encounter (Signed)
Pt had an appointment today and was a no show, the interpreter was here in house and so we used her to call the patient to see if she could still make the appointment. Pt states that her ride was unable to bring her. Pt also asked if we could go over her lab results from her 1hr gtt. Informed that due to it being elevated that we needed her to come in and do a 2hr or 3hr gtt. Pt needed explanation in great length why we needed to do the additional test to determine if she was GDM. Pt states she is having trouble getting rides to her appointments and doesn't have anyone to keep her child that she's feels safe with. Informed pt that if it was easier to get to [facility] we could transfer her care, and she could also bring her child to her appointments if needed. Pt states [place] isn't easier it was not having a reliable ride. Discussed with our office manager about transportation options for her. [facility] offers transportation help and so we will work on getting this service for her so that she could get to her appointments.  Pt verbalizes and understands we will call her back to get her appointments set up.

## 2020-01-20 ENCOUNTER — Other Ambulatory Visit: Payer: Self-pay

## 2020-01-20 ENCOUNTER — Encounter: Payer: Self-pay | Admitting: Radiology

## 2020-01-20 ENCOUNTER — Ambulatory Visit (INDEPENDENT_AMBULATORY_CARE_PROVIDER_SITE_OTHER): Payer: Self-pay | Admitting: Obstetrics and Gynecology

## 2020-01-20 VITALS — BP 146/89 | HR 83 | Wt 154.0 lb

## 2020-01-20 DIAGNOSIS — Z98891 History of uterine scar from previous surgery: Secondary | ICD-10-CM

## 2020-01-20 DIAGNOSIS — I10 Essential (primary) hypertension: Secondary | ICD-10-CM | POA: Insufficient documentation

## 2020-01-20 DIAGNOSIS — O3442 Maternal care for other abnormalities of cervix, second trimester: Secondary | ICD-10-CM

## 2020-01-20 DIAGNOSIS — Z8751 Personal history of pre-term labor: Secondary | ICD-10-CM

## 2020-01-20 DIAGNOSIS — D259 Leiomyoma of uterus, unspecified: Secondary | ICD-10-CM

## 2020-01-20 DIAGNOSIS — Z789 Other specified health status: Secondary | ICD-10-CM

## 2020-01-20 DIAGNOSIS — O0990 Supervision of high risk pregnancy, unspecified, unspecified trimester: Secondary | ICD-10-CM

## 2020-01-20 DIAGNOSIS — O4440 Low lying placenta NOS or without hemorrhage, unspecified trimester: Secondary | ICD-10-CM

## 2020-01-20 DIAGNOSIS — O09522 Supervision of elderly multigravida, second trimester: Secondary | ICD-10-CM

## 2020-01-20 DIAGNOSIS — O132 Gestational [pregnancy-induced] hypertension without significant proteinuria, second trimester: Secondary | ICD-10-CM

## 2020-01-20 DIAGNOSIS — O09299 Supervision of pregnancy with other poor reproductive or obstetric history, unspecified trimester: Secondary | ICD-10-CM

## 2020-01-20 DIAGNOSIS — O283 Abnormal ultrasonic finding on antenatal screening of mother: Secondary | ICD-10-CM

## 2020-01-20 DIAGNOSIS — O3410 Maternal care for benign tumor of corpus uteri, unspecified trimester: Secondary | ICD-10-CM

## 2020-01-20 DIAGNOSIS — R87612 Low grade squamous intraepithelial lesion on cytologic smear of cervix (LGSIL): Secondary | ICD-10-CM

## 2020-01-20 DIAGNOSIS — O99810 Abnormal glucose complicating pregnancy: Secondary | ICD-10-CM

## 2020-01-20 DIAGNOSIS — Z8759 Personal history of other complications of pregnancy, childbirth and the puerperium: Secondary | ICD-10-CM

## 2020-01-20 DIAGNOSIS — O10919 Unspecified pre-existing hypertension complicating pregnancy, unspecified trimester: Secondary | ICD-10-CM

## 2020-01-20 DIAGNOSIS — Z3A20 20 weeks gestation of pregnancy: Secondary | ICD-10-CM

## 2020-01-20 MED ORDER — NIFEDIPINE ER OSMOTIC RELEASE 30 MG PO TB24: 30.0000 mg | ORAL_TABLET | Freq: Every day | ORAL | 4 refills | Status: DC

## 2020-01-20 NOTE — Patient Instructions (Signed)
Hipertensin durante el embarazo Hypertension During Pregnancy La hipertensin tambin se denomina presin arterial alta. La presin arterial alta significa que la sangre se mueve por el cuerpo con mucha fuerza. Puede causar problemas para usted y su beb. Durante el embarazo pueden producirse diferentes tipos de presin arterial alta. Los tipos son los siguientes:  Presin arterial alta antes de quedar embarazada. Esto se denomina hipertensin crnica..  Puede continuar durante el embarazo. El mdico querr continuar controlndole la presin arterial. Es posible que necesite medicamentos para Theatre manager la presin arterial bajo control mientras est embarazada. Deber concurrir a visitas de seguimiento despus de haber tenido el beb.  Presin arterial alta que sube durante el embarazo cuando antes era normal. Esto se denomina hipertensin gestacional. Por lo general, mejorar despus de que tenga el beb, pero el mdico deber controlarle la presin arterial para asegurarse de que est mejorando.  Presin arterial muy alta durante el embarazo. Esto se denomina preeclampsia. La presin arterial muy alta es una emergencia que debe controlarse y tratarse de inmediato.  Puede desarrollar presin arterial muy alta despus del parto. Esto se denomina preeclampsia posparto. Esto normalmente ocurre dentro de las 48 horas despus del nacimiento del beb, pero puede aparecer hasta 6 semanas despus de dar a luz. Esto es poco frecuente. Lowry Ram modo me afecta? Si tiene presin arterial alta durante el embarazo, tiene ms probabilidades de desarrollar presin arterial alta:  A medida que envejece.  Si queda embarazada nuevamente. En algunos casos, la presin arterial alta durante el embarazo puede causar lo siguiente:  Accidente cerebrovascular.  Infarto de miocardio.  [employer] riones, los pulmones o el hgado.  Preeclampsia.  Movimientos espasmdicos que no puede controlar  (convulsiones).  Problemas con la placenta. Cmo afecta esto al beb? El beb puede:  Nacer antes de tiempo.  No pesar tanto como debera.  No controlar bien el trabajo de [place], lo que conduce a un parto por cesrea. Cules son los riesgos?  Haber tenido presin arterial alta durante un embarazo anterior.  Tener sobrepeso.  Tener 35 aos o ms.  Estar embarazada por primera vez.  Estar embarazada de ms de un beb.  Embarazarse a travs de mtodos de fertilizacin como fertilizacin in vitro (FIV).  Tener otros problemas, como diabetes o enfermedad renal.  Tener familiares con presin arterial alta. Qu puedo hacer para disminuir el riesgo?   Mantenga un peso saludable.  Siga una dieta saludable.  Siga las indicaciones del mdico acerca de tratar cualquier problema mdico que tenga antes de quedar embarazada. Es muy importante concurrir a todas las visitas al [employer]. El mdico verificar su presin arterial y se asegurar de que el embarazo progrese como corresponde. El tratamiento debe comenzar con prontitud si se detecta un problema. Cmo se trata? El tratamiento de la presin arterial alta durante el embarazo puede variar segn el tipo de presin arterial alta que tenga y su gravedad.  Es posible que tenga que tomar medicamentos para la presin arterial.  Si ha estado tomando un medicamento para la presin arterial, es posible que deba cambiar el medicamento durante el embarazo si no es seguro para el beb.  Si el mdico cree que usted podra tener presin arterial muy alta, puede indicarle que tome una dosis baja de aspirina durante el [place].  Si tiene presin arterial muy alta, es posible que Patent attorney en el hospital para que usted y el beb puedan ser vigilados de cerca. Es posible que deba tomar medicamentos para disminuir la  presin arterial. Este medicamento se puede administrar por boca o a travs de un tubo (catter) intravenoso.  En ciertos  casos, si su afeccin empeora, es posible que necesite dar a luz prematuramente. Siga estas instrucciones en su casa: Comida y bebida   Beba suficiente lquido para mantener el pis (laorina) de color amarillo plido.  Evite la cafena. Estilo de vida  No consuma ningn producto que contenga nicotina o tabaco, como cigarrillos, cigarrillos electrnicos y tabaco de Higher education careers adviser. Si necesita ayuda para dejar de fumar, consulte al mdico.  No consuma drogas ni alcohol.  Evite el estrs.  Haga reposo y [place].  La actividad fsica regular puede ayudar. Consulte al mdico qu tipos de ejercicios son mejores para usted. Indicaciones generales  Use los medicamentos de venta libre y los recetados solamente como se lo haya indicado el mdico.  Concurra a todas las visitas de atencin prenatal y de seguimiento como se lo haya indicado el mdico. Esto es importante. Comunquese con un mdico si:  Tiene sntomas a los que su mdico le indic que debera estar [place], tales como: ? Dolores de [place]. ? Nuseas. ? Vmitos. ? Dolor en el vientre (abdominal). ? Mareos. ? Desvanecimiento. Solicite ayuda inmediatamente si:  Tiene lo siguiente: ? Dolor muy intenso en el vientre que no mejora con el [place]. ? Dolor de cabeza muy intenso que no mejora. ? Vmitos que no mejoran. ? Aumenta de peso de forma rpida y repentina. ? Hinchazn repentina en las manos, los tobillos o el rostro. ? Hemorragia vaginal. ? Sangre en la orina. ? Visin borrosa. ? Visin doble. ? Falta de aire. ? Tourist information centre manager. ? Debilidad en un lado del cuerpo. ? Dificultad para hablar.  El beb no se mueve tanto como sera usual. Resumen  La presin arterial alta tambin se denomina hipertensin.  La presin arterial alta significa que la sangre se mueve por el cuerpo con mucha fuerza.  La presin arterial alta puede causarles problemas a usted y al beb.  Concurra a todas las visitas de seguimiento  como se lo haya indicado el mdico. Esto es importante. Esta informacin no tiene Marine scientist el consejo del mdico. Asegrese de hacerle al mdico cualquier pregunta que tenga. Document Revised: 06/19/2018 Document Reviewed: 06/19/2018 Elsevier Patient Education  [place].

## 2020-01-20 NOTE — Progress Notes (Signed)
Prenatal Visit Note Date: 01/20/2020 Clinic: [facility]  Subjective:  Cassandra Stokes is a 39 y.o. G2P0101 at [redacted]w[redacted]d being seen today for ongoing prenatal care.  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  Patient reports no complaints.   Contractions: Not present. Vag. Bleeding: None.  Movement: Present. Denies leaking of fluid.   The following portions of the patient's history were reviewed and updated as appropriate: allergies, current medications, past family history, past medical history, past social history, past surgical history and problem list. Problem list updated.  Objective:   Vitals:   01/20/20 0845  BP: (!) 146/89  Pulse: 83  Weight: 154 lb (69.9 kg)    Fetal Status: Fetal Heart Rate (bpm): 145   Movement: Present     General:  Alert, oriented and cooperative. Patient is in no acute distress.  Skin: Skin is warm and dry. No rash noted.   Cardiovascular: Normal heart rate noted  Respiratory: Normal respiratory effort, no problems with respiration noted  Abdomen: Soft, gravid, appropriate for gestational age. Pain/Pressure: Absent     Pelvic:  Cervical exam deferred        Extremities: Normal range of motion.  Edema: None  Mental Status: Normal  mood and affect. Normal behavior. Normal judgment and thought content.   Urinalysis:      Assessment and Plan:  Pregnancy: G2P0101 at [redacted]w[redacted]d  1. Supervision of high risk pregnancy, antepartum Routine care. See below. Pt got 1st covid shot in may before she knew she was pregnant. I d/w her that it's okay to get now that's pregnant and she is amenable to getting it.  - Glucose Tolerance, 2 Hours w/1 Hour  2. [redacted] weeks gestation of pregnancy - Glucose Tolerance, 2 Hours w/1 Hour  3. Transient hypertension of pregnancy in second trimester  4. Chronic hypertension affecting pregnancy Given prior BPs, I d/w her that I recommend giving her the cHTN dx. I strongly urged start the low dose asa prescribed at her last visit. I also told her I recommend starting bp meds which she is amenable to. Will start procardia xl 30 qday  5. 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 Uterine fibroid in pregnancy Small. Seen on u/s. Shouldn't affect pregnancy  7. Abnormal glucose affecting pregnancy Failed early 1h. 2h GTT today. I d/w her that if she passes then she'll need another when at 28wks  8. Echogenic intracardiac focus of fetus on prenatal ultrasound Low risk panorama.   9. History of cesarean delivery D/w her re: delivery mode later in pregnancy  10. History of fetal anomaly in prior pregnancy, currently pregnant Declined genetic counseling. mfm following with serial u/s   11. History of preterm delivery No interventions indicated  12. History of severe pre-eclampsia  13. Language barrier Interpreter used  14. Low-lying placenta D/w pt. Recommend pelvic rest  15. Multigravida of advanced maternal age in second trimester See  above.   Preterm labor symptoms and general obstetric precautions including but not limited to vaginal bleeding, contractions, leaking of fluid and fetal movement were reviewed in  detail with the patient. Please refer to After Visit Summary for other counseling recommendations.  RTC: 1wk for bp check. 2-3 wks for rob   Aletha Halim, MD

## 2020-01-20 NOTE — Progress Notes (Signed)
Not taking baby aspirin

## 2020-01-21 LAB — GLUCOSE TOLERANCE, 2 HOURS W/ 1HR
Glucose, 1 hour: 189 mg/dL — ABNORMAL HIGH (ref 65–179)
Glucose, 2 hour: 169 mg/dL — ABNORMAL HIGH (ref 65–152)
Glucose, Fasting: 88 mg/dL (ref 65–91)

## 2020-01-22 ENCOUNTER — Encounter: Payer: Self-pay | Admitting: Obstetrics and Gynecology

## 2020-01-22 DIAGNOSIS — O24410 Gestational diabetes mellitus in pregnancy, diet controlled: Secondary | ICD-10-CM

## 2020-01-22 HISTORY — DX: Gestational diabetes mellitus in pregnancy, diet controlled: O24.410

## 2020-01-26 ENCOUNTER — Other Ambulatory Visit: Payer: Self-pay

## 2020-01-26 ENCOUNTER — Ambulatory Visit (INDEPENDENT_AMBULATORY_CARE_PROVIDER_SITE_OTHER): Payer: Self-pay | Admitting: *Deleted

## 2020-01-26 VITALS — BP 127/84 | HR 102

## 2020-01-26 DIAGNOSIS — O24419 Gestational diabetes mellitus in pregnancy, unspecified control: Secondary | ICD-10-CM

## 2020-01-26 MED ORDER — ACCU-CHEK SOFTCLIX LANCETS MISC: 1.0000 | Freq: Four times a day (QID) | 12 refills | Status: DC

## 2020-01-26 MED ORDER — ACCU-CHEK GUIDE W/DEVICE KIT: 1.0000 | PACK | Freq: Four times a day (QID) | 0 refills | Status: AC

## 2020-01-26 MED ORDER — ACCU-CHEK GUIDE VI STRP: ORAL_STRIP | 12 refills | Status: DC

## 2020-01-26 NOTE — Progress Notes (Signed)
Subjective:  Cassandra Stokes is a 39 y.o. female here for BP check.   Hypertension ROS: taking medications as instructed, no medication side effects noted, no TIA's, no chest pain on exertion, no dyspnea on exertion and no swelling of ankles.    Objective:  BP 127/84   Pulse (!) 102   LMP 09/18/2019 (Approximate)   Appearance alert, well appearing, and in no distress. General exam BP noted to be well controlled today in office.    Assessment:   Blood Pressure well controlled.   Plan:  Follow up at next OB visit.   Also discussed with patient her GDM results. Discussed referral to [facility] and supplies sent in to pharmacy. Will send note to [facility] to see if they can help with getting pt the supplies due to financial difficulty.

## 2020-01-26 NOTE — Progress Notes (Signed)
Patient was assessed and managed by nursing staff during this encounter. I have reviewed the chart and agree with the documentation and plan. I have also made any necessary editorial changes.  Aletha Halim, MD 01/26/2020 12:59 PM

## 2020-02-01 ENCOUNTER — Ambulatory Visit: Payer: Self-pay | Admitting: Registered"

## 2020-02-01 ENCOUNTER — Other Ambulatory Visit: Payer: Self-pay

## 2020-02-01 ENCOUNTER — Encounter: Payer: Self-pay | Attending: Obstetrics and Gynecology | Admitting: Registered"

## 2020-02-01 DIAGNOSIS — O24419 Gestational diabetes mellitus in pregnancy, unspecified control: Secondary | ICD-10-CM | POA: Insufficient documentation

## 2020-02-01 DIAGNOSIS — Z3A21 21 weeks gestation of pregnancy: Secondary | ICD-10-CM | POA: Insufficient documentation

## 2020-02-01 DIAGNOSIS — Z596 Low income: Secondary | ICD-10-CM | POA: Insufficient documentation

## 2020-02-01 NOTE — Progress Notes (Signed)
Interpreter services provided by Debroah Baller [phone] from [employer] video  Patient was seen on 02/01/20 for Gestational Diabetes self-management. EDD 06/06/20. Patient states no history of GDM. Diet history obtained. Patient has restricted diet since getting GDM diagnosis. Beverages include water. Prior to GDM diagnosis, patient was likely eating excess carbohydrates in the form of juice and rice.  The following learning objectives were met by the patient :   Describes the effects of carbohydrates on blood glucose levels  Demonstrates ability to create a balanced meal plan  Demonstrates carbohydrate counting (needs review)  States when to check blood glucose levels  Demonstrates proper blood glucose monitoring techniques  Plan:  Aim for 3 Carbohydrate Choices per meal (45 grams) +/- 1 either way  Aim for 1-2 Carbohydrate Choices per snack Begin checking Blood Glucose before breakfast and 2 hours after first bite of breakfast, lunch and dinner as directed by MD  Bring Log Book/Sheet and meter to every medical appointment  Baby Scripts: Patient not appropriate for Baby Scripts due to language barrier. Take medication if directed by MD  Blood glucose monitor given: Pro[employer] Lot # [id number] CBG: 118 mg/dL  >30 min spent teaching self-monitoring blood sugar. Patient seemed confused by blood sugar log and RD reviewed with her several times, as well as process of checking blood sugar, when to check, target blood sugar levels, etc.  Patient was worried about what she can eat. RD only had enough time today to cover foods and work on creating balanced meals. RD encouraged patient to return for follow-up to complete education. Patient agreed to returning in 3 weeks.  Patient instructed to monitor glucose levels: FBS: 60 - 95 mg/dl 2 hour: <120 mg/dl  Patient received the following handouts:  Nutrition Diabetes and Pregnancy  Carbohydrate Counting List  Blood glucose Log Sheet  Patient will be seen  for follow-up in 3 weeks or as needed.

## 2020-02-02 ENCOUNTER — Other Ambulatory Visit: Payer: Self-pay

## 2020-02-09 ENCOUNTER — Other Ambulatory Visit: Payer: Self-pay

## 2020-02-09 ENCOUNTER — Ambulatory Visit (INDEPENDENT_AMBULATORY_CARE_PROVIDER_SITE_OTHER): Payer: Self-pay | Admitting: Student

## 2020-02-09 VITALS — BP 121/78 | HR 81 | Wt 155.0 lb

## 2020-02-09 DIAGNOSIS — O0990 Supervision of high risk pregnancy, unspecified, unspecified trimester: Secondary | ICD-10-CM

## 2020-02-09 DIAGNOSIS — Z3A23 23 weeks gestation of pregnancy: Secondary | ICD-10-CM

## 2020-02-09 NOTE — Progress Notes (Signed)
   PRENATAL VISIT NOTE  Subjective:  Cassandra Stokes is a 39 y.o. G2P0101 at [redacted]w[redacted]d being seen today for ongoing prenatal care.  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  Patient reports no complaints. She does not find diabetic diet hard to follow, does not report feeling hypo or hyper glycemic, reports that she has made small modifications (like stopping drinking juice) and that overall she feels it is going well. Is very diligent about checking her sugars.  Contractions: Not present. Vag. Bleeding: None.  Movement: Present. Denies leaking of fluid.   The following portions of the patient's history were reviewed and updated as appropriate: allergies, current medications, past family history, past medical history, past social history, past surgical history and problem list.   Objective:   Vitals:   02/09/20 1011  BP: 121/78  Pulse: 81  Weight: 155 lb (70.3 kg)    Fetal Status: Fetal Heart Rate (bpm): 151   Movement: Present     General:  Alert, oriented and cooperative. Patient is in no acute distress.  Skin: Skin is warm and dry. No rash noted.   Cardiovascular: Normal heart rate noted  Respiratory: Normal respiratory effort, no problems with  respiration noted  Abdomen: Soft, gravid, appropriate for gestational age.  Pain/Pressure: Absent     Pelvic: Cervical exam deferred        Extremities: Normal range of motion.  Edema: None  Mental Status: Normal mood and affect. Normal behavior. Normal judgment and thought content.   Assessment and Plan:  Pregnancy: G2P0101 at [redacted]w[redacted]d  1. [redacted] weeks gestation of pregnancy   2. Supervision of high risk pregnancy, antepartum    -BP WNL today  -Long conversation about VBAC vs. RCS; patient concerned with which is "safer" and what would be best for her. RvB discussed, and patient was given information to read over and think about, and will schedule patient with MD next visit to discuss options.   -Patient doing great!  -Sugar is well controlled: [place] between 70-99 with 75%  below 95 Post prandial well controlled; one value over 120   Preterm labor symptoms and general obstetric precautions including but not limited to vaginal bleeding, contractions, leaking of fluid and fetal movement were reviewed in detail with the patient. Please refer to After Visit Summary for other counseling recommendations.  Return in about 4 weeks (around 03/08/2020), or HROB with MD to talk about VBAC vs. repeat.  Future Appointments  Date Time Provider [place]  02/11/2020 11:15 AM [facility] NURSE [facility]  02/11/2020 11:30 AM [facility] US3 [facility]  02/22/2020  9:15 AM [facility]-EDUCATION [facility]  03/08/2020  1:30 PM Caren Macadam, MD [facility]    [place], CNM

## 2020-02-09 NOTE — Progress Notes (Deleted)
   PRENATAL VISIT NOTE  Subjective:  Cassandra Stokes is a 39 y.o. G2P0101 at [redacted]w[redacted]d being seen today for ongoing prenatal care.  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  Patient reports no complaints.  Contractions: Not present. Vag. Bleeding: None.  Movement: Present. Denies leaking of fluid.   The following portions of the patient's history were reviewed and updated as appropriate: allergies, current medications, past family history, past medical history, past social history, past surgical history and problem list.   Objective:   Vitals:   02/09/20 1011  BP: 121/78  Pulse: 81  Weight: 155 lb (70.3 kg)    Fetal Status: Fetal Heart Rate (bpm): 151   Movement: Present     General:  Alert, oriented and cooperative. Patient is in no acute distress.  Skin: Skin is warm and dry. No rash noted.   Cardiovascular: Normal heart rate noted  Respiratory: Normal respiratory effort, no problems with respiration noted  Abdomen: Soft, gravid, appropriate for gestational age.  Pain/Pressure: Absent     Pelvic: {Blank single:19197::"Cervical exam performed in the presence of a chaperone","Cervical exam deferred"}        Extremities: Normal range of motion.   Edema: None  Mental Status: Normal mood and affect. Normal behavior. Normal judgment and thought content.   Assessment and Plan:  Pregnancy: G2P0101 at [redacted]w[redacted]d There are no diagnoses linked to this encounter. {Blank single:19197::"Term","Preterm"} labor symptoms and general obstetric precautions including but not limited to vaginal bleeding, contractions, leaking of fluid and fetal movement were reviewed in detail with the patient. Please refer to After Visit Summary for other counseling recommendations.   No follow-ups on file.  Future Appointments  Date Time Provider [place]  02/11/2020 11:15 AM [facility] NURSE [facility]  02/11/2020 11:30 AM [facility] US3 [facility]  02/22/2020  9:15 AM [facility]-EDUCATION [facility] [place], CNM

## 2020-02-09 NOTE — Patient Instructions (Signed)
Parto vaginal despus de un parto por cesrea Vaginal Birth After Cesarean Delivery  Un parto vaginal despus de un parto por cesrea (PVDC) es dar a luz por la vagina despus de haber dado a luz por medio de una intervencin quirrgica llamada cesrea. Es posible que el PVDC sea una alternativa segura para usted, segn su salud y Quarry manager. Es importante que hable con su mdico acerca del PVDC desde comienzos del Media planner de modo que pueda comprender los riesgos, beneficios y opciones. Hablar sobre estos temas desde el comienzo le permitir tener tiempo para Development worker, international aid. Quines son las mejores candidatas para Raynelle Jan un PVDC? Las mejores candidatas para Best boy un PVDC son las mujeres que cumplen con los siguientes requisitos:  Tuvieron uno o dos partos por Dispensing optician, y las incisiones realizadas durante los partos fueron horizontales (transversales bajas).  No tienen una cicatriz uterina vertical (clsica).  No han tenido una ruptura en la pared del tero (ruptura uterina).  Piensan tener otros embarazos. Es ms probable que un PVDC se realice correctamente en los siguientes casos:  Mujeres que ya tuvieron partos vaginales antes.  Cuando el trabajo de parto comienza sin ninguna intervencin (de forma espontnea) antes de la fecha prevista. Cules son los beneficios del PVDC? Algunos de los beneficios de que el beb nazca por parto vaginal en lugar de por cesrea:  Estada ms corta en el hospital.  Menor tiempo de recuperacin.  Menos dolor.  Evitar los [employer] a las cirugas mayores, como las infecciones y los cogulos de [place].  Menor prdida de [place] y Garment/textile technologist probabilidad de Designer, industrial/product sangre de un donante (transfusin). Cules son los riesgos del PVDC? El principal riesgo de Surveyor, minerals un PVDC es que existe una posibilidad de que [place], lo que obligar al mdico a Optometrist una cesrea. Los otros riesgos son muy poco frecuentes y pueden ser  los siguientes:  Desgarro (ruptura) de la cicatriz de un parto por cesrea anterior.  Otros riesgos asociados con los [employer]. Si se debe repetir un parto por cesrea, los riesgos [employer] siguientes:  Prdida de [place].  Infeccin.  Cogulos de [place].  Lesin en los rganos circundantes.  Extirpacin del tero (histerectoma), si se daa.  Problemas de placenta en embarazos futuros. Qu ms debo [place] alternativas? El parto vaginal despus de Elmon Else es similar a un parto espontneo vaginal normal. Por lo tanto, es seguro:  Intentarlo cuando se trata de gemelos.  Que el mdico intente girar al beb si se encuentra de nalgas (versin ceflica externa) durante el trabajo de [place].  Colocar anestesia epidural para Best boy. Considerar posibilidades sobre dnde le gustara que ocurriera el [place]. El PVDC debe llevarse a cabo en centros donde se pueda realizar un parto por cesrea de urgencia. No se recomiendan los partos domiciliarios si planea tener un PVDC. Cualquier cambio en su salud o la de su beb durante el embarazo puede ser motivo de un cambio de decisin respecto del parto vaginal. El mdico podra recomendarle no realizar un PVDC en los siguientes casos:  El beb parece pesar 8.8lb (4kg) o ms.  Tiene preeclampsia. Esta es una afeccin que provoca el aumento de la presin arterial y otros sntomas, como hinchazn y dolores de [place].  Tendr un PVDC menos de 60meses despus de un parto por cesrea.  Ya pas la fecha prevista del [place].  Necesita que se inicie el trabajo de parto (induccin) porque el cuello uterino no est listo para  el parto (desfavorable). Dnde encontrar ms informacin  Asociacin Americana del Media planner (American Pregnancy Association): https://www.benson-chung.com/.  Congreso Estadounidense de Obstetras y Careers information officer of Obstetricians and Gynecologists): acog.org Resumen  Un parto vaginal  despus de un parto por cesrea (PVDC) es dar a luz por la vagina despus de haber dado a luz por medio de una intervencin quirrgica llamada cesrea. Es posible que el PVDC sea una alternativa segura para usted, segn su salud y Quarry manager.  Hable con el mdico desde comienzos del embarazo para que pueda [employer], los beneficios y las [place], y Best boy mucho tiempo para Development worker, international aid.  El principal riesgo de Surveyor, minerals un PVDC es que existe una posibilidad de que [place], lo que obligar al mdico a Optometrist una cesrea. Los otros riesgos son muy poco frecuentes. Esta informacin no tiene Marine scientist el consejo del mdico. Asegrese de hacerle al mdico cualquier pregunta que tenga. Document Revised: 11/26/2016 Document Reviewed: 11/26/2016 Elsevier Patient Education  [place].

## 2020-02-11 ENCOUNTER — Other Ambulatory Visit: Payer: Self-pay

## 2020-02-11 ENCOUNTER — Other Ambulatory Visit: Payer: Self-pay | Admitting: *Deleted

## 2020-02-11 ENCOUNTER — Ambulatory Visit: Payer: Self-pay | Attending: Obstetrics and Gynecology

## 2020-02-11 ENCOUNTER — Ambulatory Visit: Payer: Self-pay | Admitting: *Deleted

## 2020-02-11 VITALS — BP 120/70 | HR 87

## 2020-02-11 DIAGNOSIS — O34219 Maternal care for unspecified type scar from previous cesarean delivery: Secondary | ICD-10-CM

## 2020-02-11 DIAGNOSIS — Z3A23 23 weeks gestation of pregnancy: Secondary | ICD-10-CM

## 2020-02-11 DIAGNOSIS — O24812 Other pre-existing diabetes mellitus in pregnancy, second trimester: Secondary | ICD-10-CM

## 2020-02-11 DIAGNOSIS — O132 Gestational [pregnancy-induced] hypertension without significant proteinuria, second trimester: Secondary | ICD-10-CM

## 2020-02-11 DIAGNOSIS — D259 Leiomyoma of uterus, unspecified: Secondary | ICD-10-CM

## 2020-02-11 DIAGNOSIS — O24410 Gestational diabetes mellitus in pregnancy, diet controlled: Secondary | ICD-10-CM

## 2020-02-11 DIAGNOSIS — O09292 Supervision of pregnancy with other poor reproductive or obstetric history, second trimester: Secondary | ICD-10-CM

## 2020-02-11 DIAGNOSIS — O358XX0 Maternal care for other (suspected) fetal abnormality and damage, not applicable or unspecified: Secondary | ICD-10-CM

## 2020-02-11 DIAGNOSIS — O4442 Low lying placenta NOS or without hemorrhage, second trimester: Secondary | ICD-10-CM

## 2020-02-11 DIAGNOSIS — O3412 Maternal care for benign tumor of corpus uteri, second trimester: Secondary | ICD-10-CM

## 2020-02-11 DIAGNOSIS — Z362 Encounter for other antenatal screening follow-up: Secondary | ICD-10-CM

## 2020-02-11 DIAGNOSIS — O09529 Supervision of elderly multigravida, unspecified trimester: Secondary | ICD-10-CM | POA: Insufficient documentation

## 2020-02-11 DIAGNOSIS — O09522 Supervision of elderly multigravida, second trimester: Secondary | ICD-10-CM

## 2020-02-11 NOTE — Progress Notes (Unsigned)
qunteros

## 2020-02-16 ENCOUNTER — Other Ambulatory Visit: Payer: Self-pay

## 2020-02-16 ENCOUNTER — Inpatient Hospital Stay (HOSPITAL_COMMUNITY)
Admission: AD | Admit: 2020-02-16 | Discharge: 2020-02-16 | Disposition: A | Discharge status: Home / Self Care | Payer: Self-pay | Attending: Obstetrics and Gynecology | Admitting: Obstetrics and Gynecology

## 2020-02-16 ENCOUNTER — Encounter (HOSPITAL_COMMUNITY): Payer: Self-pay | Admitting: Obstetrics and Gynecology

## 2020-02-16 DIAGNOSIS — O10919 Unspecified pre-existing hypertension complicating pregnancy, unspecified trimester: Secondary | ICD-10-CM

## 2020-02-16 DIAGNOSIS — O09522 Supervision of elderly multigravida, second trimester: Secondary | ICD-10-CM | POA: Insufficient documentation

## 2020-02-16 DIAGNOSIS — O10912 Unspecified pre-existing hypertension complicating pregnancy, second trimester: Secondary | ICD-10-CM | POA: Insufficient documentation

## 2020-02-16 DIAGNOSIS — Z79899 Other long term (current) drug therapy: Secondary | ICD-10-CM | POA: Insufficient documentation

## 2020-02-16 DIAGNOSIS — Z3A24 24 weeks gestation of pregnancy: Secondary | ICD-10-CM | POA: Insufficient documentation

## 2020-02-16 DIAGNOSIS — R519 Headache, unspecified: Secondary | ICD-10-CM | POA: Insufficient documentation

## 2020-02-16 DIAGNOSIS — O26892 Other specified pregnancy related conditions, second trimester: Secondary | ICD-10-CM | POA: Insufficient documentation

## 2020-02-16 LAB — URINALYSIS, ROUTINE W REFLEX MICROSCOPIC
Bilirubin Urine: NEGATIVE
Glucose, UA: 50 mg/dL — AB
Ketones, ur: 5 mg/dL — AB
Leukocytes,Ua: NEGATIVE
Nitrite: NEGATIVE
Protein, ur: NEGATIVE mg/dL
Specific Gravity, Urine: 1.002 — ABNORMAL LOW (ref 1.005–1.030)
pH: 7 (ref 5.0–8.0)

## 2020-02-16 LAB — CBC
HCT: 34.9 % — ABNORMAL LOW (ref 36.0–46.0)
Hemoglobin: 11.9 g/dL — ABNORMAL LOW (ref 12.0–15.0)
MCH: 31 pg (ref 26.0–34.0)
MCHC: 34.1 g/dL (ref 30.0–36.0)
MCV: 90.9 fL (ref 80.0–100.0)
Platelets: 272 10*3/uL (ref 150–400)
RBC: 3.84 MIL/uL — ABNORMAL LOW (ref 3.87–5.11)
RDW: 12.4 % (ref 11.5–15.5)
WBC: 7.5 10*3/uL (ref 4.0–10.5)
nRBC: 0 % (ref 0.0–0.2)

## 2020-02-16 LAB — COMPREHENSIVE METABOLIC PANEL
ALT: 15 U/L (ref 0–44)
AST: 18 U/L (ref 15–41)
Albumin: 3.2 g/dL — ABNORMAL LOW (ref 3.5–5.0)
Alkaline Phosphatase: 54 U/L (ref 38–126)
Anion gap: 11 (ref 5–15)
BUN: <5 mg/dL — ABNORMAL LOW (ref 6–20)
CO2: 20 mmol/L — ABNORMAL LOW (ref 22–32)
Calcium: 8.8 mg/dL — ABNORMAL LOW (ref 8.9–10.3)
Chloride: 108 mmol/L (ref 98–111)
Creatinine, Ser: 0.42 mg/dL — ABNORMAL LOW (ref 0.44–1.00)
GFR, Estimated: >60 mL/min (ref >60)
Glucose, Bld: 121 mg/dL — ABNORMAL HIGH (ref 70–99)
Potassium: 3 mmol/L — ABNORMAL LOW (ref 3.5–5.1)
Sodium: 139 mmol/L (ref 135–145)
Total Bilirubin: 0.4 mg/dL (ref 0.3–1.2)
Total Protein: 6.7 g/dL (ref 6.5–8.1)

## 2020-02-16 LAB — PROTEIN / CREATININE RATIO, URINE
Creatinine, Urine: <10 mg/dL
Total Protein, Urine: <6 mg/dL

## 2020-02-16 MED ORDER — BUTALBITAL-APAP-CAFFEINE 50-325-40 MG PO TABS
2.0000 | ORAL_TABLET | Freq: Once | ORAL | Status: DC
  Filled 2020-02-16: qty 2

## 2020-02-16 NOTE — MAU Note (Signed)
Pt reports she had a headache at home and was shaking . Reports elevated pressures and ? Preeclampsia.

## 2020-02-16 NOTE — MAU Provider Note (Signed)
Chief Complaint  Patient presents with  . Headache     First Provider Initiated Contact with Patient 02/16/20 0158      S: Cassandra Stokes  is a 39 y.o. y.o. year old G74P0101 female at [redacted]w[redacted]d weeks gestation who presents to MAU via EMS with elevated blood pressures. Hx of chronic hypertension. Current blood pressure medication: Procardia XL - last took yesterday at 1600. Patient denies hx of PEC in previous pregnancy   Patient reports that she started to have tingling in ears and headache at home this morning. Patient reports that HA is frontal pressure more than pain, rates 4/10- has not taken any medication for HA. She denies vision changes or RUQ pain.   Patient reports that she had leg shaking but was initially nervous, stopped after she calmed down. She denies contractions, vaginal bleeding or discharge. +FM.  O:  Patient Vitals for the past 24 hrs:  BP Temp Pulse Resp SpO2  02/16/20 0404 133/87 - (!) 102 16 100 %  02/16/20 0245 (!) 132/94 - (!) 101 - 100 %  02/16/20 0231 132/85 - 97 - -  02/16/20 0216 131/81 - (!) 107 - -  02/16/20 0215 - - - - 100 %  02/16/20 0200 (!) 134/93 - (!) 117 - 100 %  02/16/20 0156 (!) 144/94 - (!) 122 - 100 %  02/16/20 0154 (!) 145/94 99.1 F (37.3 C) (!) 118 17 -   General: NAD Heart: Regular rate Lungs: Normal rate and effort Abd: Soft, NT, Gravid, S=D Extremities: no pedal edema Neuro: 2+ deep tendon reflexes, No clonus Pelvic: NEFG, no bleeding or LOF.      Fetal monitoring:  135/moderate/+accels/no decel  UI   Results for orders placed or performed during the hospital encounter of 02/16/20 (from the past 24 hour(s))  Protein / creatinine ratio, urine     Status: None   Collection Time: 02/16/20  2:33 AM  Result Value Ref Range   Creatinine, Urine <10 mg/dL   Total Protein, Urine <6 mg/dL   Protein Creatinine Ratio        0.00 - 0.15 mg/mg[Cre]  Urinalysis, Routine w reflex microscopic Urine, Clean Catch     Status:  Abnormal   Collection Time: 02/16/20  2:33 AM  Result Value Ref Range   Color, Urine COLORLESS (A) YELLOW   APPearance CLEAR CLEAR   Specific Gravity, Urine 1.002 (L) 1.005 - 1.030   pH 7.0 5.0 - 8.0   Glucose, UA 50 (A) NEGATIVE mg/dL   Hgb urine dipstick SMALL (A) NEGATIVE   Bilirubin Urine NEGATIVE NEGATIVE   Ketones, ur 5 (A) NEGATIVE mg/dL   Protein, ur NEGATIVE NEGATIVE mg/dL   Nitrite NEGATIVE NEGATIVE   Leukocytes,Ua NEGATIVE NEGATIVE   RBC / HPF 0-5 0 - 5 RBC/hpf   WBC, UA 0-5 0 - 5 WBC/hpf   Bacteria, UA RARE (A) NONE SEEN   Squamous Epithelial / LPF 0-5 0 - 5  CBC     Status: Abnormal   Collection Time: 02/16/20  2:44 AM  Result Value Ref Range   WBC 7.5 4.0 - 10.5 K/uL   RBC 3.84 (L) 3.87 - 5.11 MIL/uL   Hemoglobin 11.9 (L) 12.0 - 15.0 g/dL   HCT 34.9 (L) 36 - 46 %   MCV 90.9 80.0 - 100.0 fL   MCH 31.0 26.0 - 34.0 pg   MCHC 34.1 30.0 - 36.0 g/dL   RDW 12.4 11.5 - 15.5 %   Platelets 272 150 -  400 K/uL   nRBC 0.0 0.0 - 0.2 %  Comprehensive metabolic panel     Status: Abnormal   Collection Time: 02/16/20  2:44 AM  Result Value Ref Range   Sodium 139 135 - 145 mmol/L   Potassium 3.0 (L) 3.5 - 5.1 mmol/L   Chloride 108 98 - 111 mmol/L   CO2 20 (L) 22 - 32 mmol/L   Glucose, Bld 121 (H) 70 - 99 mg/dL   BUN <5 (L) 6 - 20 mg/dL   Creatinine, Ser 0.42 (L) 0.44 - 1.00 mg/dL   Calcium 8.8 (L) 8.9 - 10.3 mg/dL   Total Protein 6.7 6.5 - 8.1 g/dL   Albumin 3.2 (L) 3.5 - 5.0 g/dL   AST 18 15 - 41 U/L   ALT 15 0 - 44 U/L   Alkaline Phosphatase 54 38 - 126 U/L   Total Bilirubin 0.4 0.3 - 1.2 mg/dL   GFR, Estimated >60 >60 mL/min   Anion gap 11 5 - 15     Treatments in MAU include Fioricet for HA- patient refuses medication @0237 , reports no pain of HA just pressure, rates pain now 0/10.  Patient questioning when she is able to go home. Discussed with patient that labs pending at this time.   Reassessment after lab results.  PEC labs negative. Patient denies s/s of  PEC. Educated and discussed warning signs and reasons to return to MAU. Continue procardia as prescribed for BP. Patient has next appointment in the office on 11/3.   Return to MAU as needed. Pt stable at time of discharge.    A: [redacted]w[redacted]d week IUP Chronic hypertension on Procardia  FHR reactive  P: Discharge home in stable condition  Preeclampsia precautions. Return to maternity admissions as needed in emergencies Hydration and FKC  Lajean Manes, CNM 02/16/2020 5:06 AM

## 2020-02-22 ENCOUNTER — Other Ambulatory Visit: Payer: Self-pay

## 2020-03-02 ENCOUNTER — Encounter: Payer: Self-pay | Attending: Obstetrics and Gynecology | Admitting: Registered"

## 2020-03-02 ENCOUNTER — Other Ambulatory Visit: Payer: Self-pay

## 2020-03-02 ENCOUNTER — Ambulatory Visit: Payer: Self-pay | Admitting: Registered"

## 2020-03-02 DIAGNOSIS — O24419 Gestational diabetes mellitus in pregnancy, unspecified control: Secondary | ICD-10-CM

## 2020-03-02 DIAGNOSIS — Z596 Low income: Secondary | ICD-10-CM | POA: Insufficient documentation

## 2020-03-02 DIAGNOSIS — Z3A21 21 weeks gestation of pregnancy: Secondary | ICD-10-CM | POA: Insufficient documentation

## 2020-03-02 NOTE — Progress Notes (Signed)
Video Interpreter services provided by Terrial Rhodes # from [place] interpreter services  Patient was seen on 03/02/20 for follow-up assessment and education for Gestational Diabetes. EDD 06/06/20. Patient states changes to diet/lifestyle including small amount of rice and only 1 tortilla with meals   Patient states she would like to drink some juice and wants to know if okay.  Patient is testing blood glucose as directed pre breakfast and 2 hours after each meal. Log Book shows CBGs WNL  The following learning objectives reviewed during follow-up visit:   How to make dietary choices based on blood sugar readings  Diluted juice vs undiluted juice  Plan:  . Based on your blood sugar readings you can have a small amount of juice. Use your blood sugar reading to guide you in deciding how much is okay   Patient instructed to monitor glucose levels: FBS: 60 - 95 mg/dl 2 hour: <120 mg/dl  Patient received the following handouts:  none  Patient will be seen for follow-up as needed.

## 2020-03-08 ENCOUNTER — Encounter: Payer: Self-pay | Admitting: Family Medicine

## 2020-03-08 ENCOUNTER — Other Ambulatory Visit: Payer: Self-pay

## 2020-03-08 ENCOUNTER — Ambulatory Visit (INDEPENDENT_AMBULATORY_CARE_PROVIDER_SITE_OTHER): Payer: Self-pay | Admitting: Family Medicine

## 2020-03-08 VITALS — BP 146/90 | HR 80 | Wt 161.0 lb

## 2020-03-08 DIAGNOSIS — O09299 Supervision of pregnancy with other poor reproductive or obstetric history, unspecified trimester: Secondary | ICD-10-CM

## 2020-03-08 DIAGNOSIS — O34219 Maternal care for unspecified type scar from previous cesarean delivery: Secondary | ICD-10-CM

## 2020-03-08 DIAGNOSIS — O0990 Supervision of high risk pregnancy, unspecified, unspecified trimester: Secondary | ICD-10-CM

## 2020-03-08 DIAGNOSIS — O24410 Gestational diabetes mellitus in pregnancy, diet controlled: Secondary | ICD-10-CM

## 2020-03-08 DIAGNOSIS — O10919 Unspecified pre-existing hypertension complicating pregnancy, unspecified trimester: Secondary | ICD-10-CM

## 2020-03-08 HISTORY — DX: Supervision of pregnancy with other poor reproductive or obstetric history, unspecified trimester: O09.299

## 2020-03-08 NOTE — Progress Notes (Signed)
Pt has not taken her procardia since 10/16, due to not knowing about refills on her RX. Pt also forgot to bring in her [facility] record. States she had appt with Diabetes education and they said her sugars where good.

## 2020-03-08 NOTE — Progress Notes (Signed)
PRENATAL VISIT NOTE  Subjective:  Cassandra Stokes is a 39 y.o. G2P0101 at [redacted]w[redacted]d being seen today for ongoing prenatal care.  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  Patient reports no complaints.  Contractions: Not present. Vag. Bleeding: None.  Movement: Present. Denies leaking of fluid.   The following portions of the patient's history were reviewed and updated as appropriate: allergies, current medications, past family history, past medical history, past social history, past surgical history and problem list.   Objective:   Vitals:   03/08/20 1334  BP: (!) 146/90  Pulse: 80  Weight: 161 lb (73 kg)    Fetal Status: Fetal Heart Rate (bpm): 138   Movement: Present     General:  Alert, oriented and cooperative. Patient is in no acute distress.  Skin: Skin is warm and dry. No rash noted.   Cardiovascular: Normal heart rate noted  Respiratory: Normal respiratory effort, no problems with respiration noted  Abdomen: Soft, gravid, appropriate for gestational age.  Pain/Pressure: Absent     Pelvic: Cervical exam deferred        Extremities: Normal range of motion.  Edema:  None  Mental Status: Normal mood and affect. Normal behavior. Normal judgment and thought content.   Assessment and Plan:  Pregnancy: G2P0101 at [redacted]w[redacted]d 1. History of postpartum hemorrhage, currently pregnant 1800cc in 2017 Consider TXA administration at delivery  2. Supervision of high risk pregnancy, antepartum Reviewed in detail plan of care No log today but was able to show me pictures of her meter Discussed logging in an app or on notes Recommended flu shot today and provided letter for [facility]  3. H/O cesarean section complicating pregnancy Desires VBAC Counseled on benefits and risks- of note CS was for fetal intolerance. VBAC Calculator ~40% Gave VBAC consent in spanish to read.  Address at next visit  4. Chronic hypertension affecting pregnancy BP up today but ran out of nifedipine due to cost Given procardia rx for 90 days and Good Rx coupon for ~$28 for a 90 day supply Reviewed importance of taking medication  Discussed plan of care --needs to start antenatal surveillance at 32 wks. Will schedule at [facility] for BPP with NST.  We discussed cost and this being the least expensive location for this service in our area.   5. Diet controlled gestational diabetes mellitus (GDM) in third trimester Fasting was low 80s and two pp seen on phone were <110 Stressed importance of bringing her log Discussed using phone as she has this with her all the time.  Encouraged diet adherence  Preterm labor symptoms and general obstetric precautions including but not limited to vaginal bleeding, contractions, leaking of fluid and fetal movement were reviewed in detail with the patient. Please refer to After Visit Summary for other counseling recommendations.   Return in about 2 weeks (around 03/22/2020) for Routine prenatal care, MD only.  Future Appointments  Date Time Provider [place]  03/21/2020 11:30 AM Donnamae Jude, MD [facility]  04/04/2020  8:45 AM  [facility] NURSE [facility]  04/04/2020  9:00 AM [facility] US1 [facility] [place]   Caren Macadam, MD

## 2020-03-10 ENCOUNTER — Ambulatory Visit: Payer: Self-pay | Admitting: *Deleted

## 2020-03-10 ENCOUNTER — Other Ambulatory Visit: Payer: Self-pay | Admitting: *Deleted

## 2020-03-10 ENCOUNTER — Other Ambulatory Visit: Payer: Self-pay

## 2020-03-10 ENCOUNTER — Encounter: Payer: Self-pay | Admitting: *Deleted

## 2020-03-10 ENCOUNTER — Ambulatory Visit: Payer: Self-pay | Attending: Obstetrics and Gynecology

## 2020-03-10 DIAGNOSIS — Z362 Encounter for other antenatal screening follow-up: Secondary | ICD-10-CM

## 2020-03-10 DIAGNOSIS — D219 Benign neoplasm of connective and other soft tissue, unspecified: Secondary | ICD-10-CM

## 2020-03-10 DIAGNOSIS — O132 Gestational [pregnancy-induced] hypertension without significant proteinuria, second trimester: Secondary | ICD-10-CM

## 2020-03-10 DIAGNOSIS — D259 Leiomyoma of uterus, unspecified: Secondary | ICD-10-CM

## 2020-03-10 DIAGNOSIS — O09292 Supervision of pregnancy with other poor reproductive or obstetric history, second trimester: Secondary | ICD-10-CM

## 2020-03-10 DIAGNOSIS — O24410 Gestational diabetes mellitus in pregnancy, diet controlled: Secondary | ICD-10-CM | POA: Insufficient documentation

## 2020-03-10 DIAGNOSIS — O34219 Maternal care for unspecified type scar from previous cesarean delivery: Secondary | ICD-10-CM | POA: Insufficient documentation

## 2020-03-10 DIAGNOSIS — O3412 Maternal care for benign tumor of corpus uteri, second trimester: Secondary | ICD-10-CM

## 2020-03-10 DIAGNOSIS — O4442 Low lying placenta NOS or without hemorrhage, second trimester: Secondary | ICD-10-CM

## 2020-03-10 DIAGNOSIS — Z3A27 27 weeks gestation of pregnancy: Secondary | ICD-10-CM

## 2020-03-10 DIAGNOSIS — O09522 Supervision of elderly multigravida, second trimester: Secondary | ICD-10-CM

## 2020-03-10 DIAGNOSIS — O358XX0 Maternal care for other (suspected) fetal abnormality and damage, not applicable or unspecified: Secondary | ICD-10-CM

## 2020-03-10 DIAGNOSIS — O99810 Abnormal glucose complicating pregnancy: Secondary | ICD-10-CM

## 2020-03-20 ENCOUNTER — Other Ambulatory Visit: Payer: Self-pay

## 2020-03-21 ENCOUNTER — Other Ambulatory Visit: Payer: Self-pay

## 2020-03-21 ENCOUNTER — Ambulatory Visit (INDEPENDENT_AMBULATORY_CARE_PROVIDER_SITE_OTHER): Payer: Self-pay | Admitting: Family Medicine

## 2020-03-21 ENCOUNTER — Encounter: Payer: Self-pay | Admitting: *Deleted

## 2020-03-21 ENCOUNTER — Encounter: Payer: Self-pay | Admitting: Family Medicine

## 2020-03-21 VITALS — BP 138/83 | HR 101 | Wt 162.0 lb

## 2020-03-21 DIAGNOSIS — O10919 Unspecified pre-existing hypertension complicating pregnancy, unspecified trimester: Secondary | ICD-10-CM

## 2020-03-21 DIAGNOSIS — O0990 Supervision of high risk pregnancy, unspecified, unspecified trimester: Secondary | ICD-10-CM

## 2020-03-21 DIAGNOSIS — O09522 Supervision of elderly multigravida, second trimester: Secondary | ICD-10-CM

## 2020-03-21 DIAGNOSIS — O34219 Maternal care for unspecified type scar from previous cesarean delivery: Secondary | ICD-10-CM

## 2020-03-21 DIAGNOSIS — O24410 Gestational diabetes mellitus in pregnancy, diet controlled: Secondary | ICD-10-CM

## 2020-03-21 NOTE — Progress Notes (Signed)
   PRENATAL VISIT NOTE  Subjective:  Cassandra Stokes is a 39 y.o. G2P0101 at [redacted]w[redacted]d being seen today for ongoing prenatal care.  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  Patient reports no complaints.  Contractions: Not present. Vag. Bleeding: None.  Movement: Present. Denies leaking of fluid.   The following portions of the patient's history were reviewed and updated as appropriate: allergies, current medications, past family history, past medical history, past social history, past surgical history and problem list.   Objective:   Vitals:   03/21/20 1129  BP: 138/83  Pulse: (!) 101  Weight: 162 lb (73.5 kg)    Fetal Status: Fetal Heart Rate (bpm): 141 Fundal Height: 30 cm Movement: Present     General:  Alert, oriented and cooperative. Patient is in no acute distress.  Skin: Skin is warm and dry. No rash noted.   Cardiovascular: Normal heart rate noted  Respiratory: Normal respiratory effort, no problems with respiration noted  Abdomen: Soft, gravid, appropriate for gestational age.  Pain/Pressure: Absent     Pelvic: Cervical exam deferred        Extremities: Normal range  of motion.  Edema: None  Mental Status: Normal mood and affect. Normal behavior. Normal judgment and thought content.   Assessment and Plan:  Pregnancy: G2P0101 at [redacted]w[redacted]d 1. Chronic hypertension affecting pregnancy BP is well controlled on procardia On ASA U/S for growth  2. Diet controlled gestational diabetes mellitus (GDM) in third trimester FBS  86-96 2 hour pp 86-112  3. H/O cesarean section complicating pregnancy R/B/A discussed Desires TOLAC--consent signed  4. Multigravida of advanced maternal age in second trimester   5. Supervision of high risk pregnancy, antepartum 29 wks Continue prenatal care.   Preterm labor symptoms and general obstetric precautions including but not limited to vaginal bleeding, contractions, leaking of fluid and fetal movement were reviewed in detail with the patient. Please refer to After Visit Summary for other counseling recommendations.   Return in 2 weeks (on 04/04/2020).  Future Appointments  Date Time Provider [place]  04/04/2020  8:45 AM [facility] NURSE [facility]  04/04/2020  9:00 AM [facility] US1 [facility]  04/05/2020 11:15 AM Caren Macadam, MD [facility]    Cassandra Jude, MD

## 2020-03-21 NOTE — Patient Instructions (Signed)

## 2020-04-04 ENCOUNTER — Encounter: Payer: Self-pay | Admitting: *Deleted

## 2020-04-04 ENCOUNTER — Ambulatory Visit: Payer: Self-pay | Admitting: *Deleted

## 2020-04-04 ENCOUNTER — Ambulatory Visit: Payer: Self-pay | Attending: Obstetrics and Gynecology

## 2020-04-04 ENCOUNTER — Other Ambulatory Visit: Payer: Self-pay

## 2020-04-04 ENCOUNTER — Other Ambulatory Visit: Payer: Self-pay | Admitting: *Deleted

## 2020-04-04 DIAGNOSIS — O24410 Gestational diabetes mellitus in pregnancy, diet controlled: Secondary | ICD-10-CM

## 2020-04-04 DIAGNOSIS — O34219 Maternal care for unspecified type scar from previous cesarean delivery: Secondary | ICD-10-CM | POA: Insufficient documentation

## 2020-04-04 DIAGNOSIS — D219 Benign neoplasm of connective and other soft tissue, unspecified: Secondary | ICD-10-CM | POA: Insufficient documentation

## 2020-04-04 DIAGNOSIS — O3413 Maternal care for benign tumor of corpus uteri, third trimester: Secondary | ICD-10-CM

## 2020-04-04 DIAGNOSIS — O4443 Low lying placenta NOS or without hemorrhage, third trimester: Secondary | ICD-10-CM

## 2020-04-04 DIAGNOSIS — Z3A31 31 weeks gestation of pregnancy: Secondary | ICD-10-CM

## 2020-04-04 DIAGNOSIS — D259 Leiomyoma of uterus, unspecified: Secondary | ICD-10-CM

## 2020-04-04 DIAGNOSIS — O358XX0 Maternal care for other (suspected) fetal abnormality and damage, not applicable or unspecified: Secondary | ICD-10-CM

## 2020-04-04 DIAGNOSIS — O09523 Supervision of elderly multigravida, third trimester: Secondary | ICD-10-CM

## 2020-04-04 DIAGNOSIS — Z362 Encounter for other antenatal screening follow-up: Secondary | ICD-10-CM

## 2020-04-04 DIAGNOSIS — O09293 Supervision of pregnancy with other poor reproductive or obstetric history, third trimester: Secondary | ICD-10-CM

## 2020-04-04 DIAGNOSIS — O133 Gestational [pregnancy-induced] hypertension without significant proteinuria, third trimester: Secondary | ICD-10-CM

## 2020-04-04 DIAGNOSIS — O10919 Unspecified pre-existing hypertension complicating pregnancy, unspecified trimester: Secondary | ICD-10-CM

## 2020-04-04 DIAGNOSIS — O99810 Abnormal glucose complicating pregnancy: Secondary | ICD-10-CM

## 2020-04-04 NOTE — Progress Notes (Signed)
C/o"headache 04/03/20"

## 2020-04-05 ENCOUNTER — Ambulatory Visit (INDEPENDENT_AMBULATORY_CARE_PROVIDER_SITE_OTHER): Payer: Self-pay | Admitting: Family Medicine

## 2020-04-05 VITALS — BP 136/83 | HR 78 | Wt 162.0 lb

## 2020-04-05 DIAGNOSIS — O09299 Supervision of pregnancy with other poor reproductive or obstetric history, unspecified trimester: Secondary | ICD-10-CM

## 2020-04-05 DIAGNOSIS — O34219 Maternal care for unspecified type scar from previous cesarean delivery: Secondary | ICD-10-CM

## 2020-04-05 DIAGNOSIS — O0990 Supervision of high risk pregnancy, unspecified, unspecified trimester: Secondary | ICD-10-CM

## 2020-04-05 DIAGNOSIS — O10919 Unspecified pre-existing hypertension complicating pregnancy, unspecified trimester: Secondary | ICD-10-CM

## 2020-04-05 DIAGNOSIS — O24410 Gestational diabetes mellitus in pregnancy, diet controlled: Secondary | ICD-10-CM

## 2020-04-05 NOTE — Progress Notes (Signed)
PRENATAL VISIT NOTE  Subjective:  Cassandra Stokes is a 39 y.o. G2P0101 at [redacted]w[redacted]d being seen today for ongoing prenatal care.  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 in second trimester, antepartum; Language barrier; History of severe pre-eclampsia; History of preterm delivery; History of fetal anomaly in prior pregnancy, currently pregnant; Multigravida of advanced maternal age in second trimester; H/O cesarean section complicating pregnancy; Abnormal glucose affecting pregnancy; Low vitamin D level; Low-lying placenta; Transient hypertension of pregnancy; Echogenic intracardiac focus of fetus on prenatal ultrasound; Chronic hypertension affecting pregnancy; GDM (gestational diabetes mellitus); and History of postpartum hemorrhage, currently pregnant on their problem list.  Patient reports no complaints.  Applied for financial aid but needs notarized letter from husband but has been unable to get this done due to only being open during week. She reports partner Cassandra Stokes works in another city and is not available on weekends.   Contractions: Not present. Vag. Bleeding: None.  Movement: Present. Denies leaking of fluid.   The following portions of the patient's history were reviewed and updated as appropriate: allergies, current medications, past family history, past medical history, past social history, past surgical history and problem list.   Objective:   Vitals:   04/05/20 1121  BP: 136/83  Pulse: 78  Weight: 162 lb (73.5 kg)    Fetal Status: Fetal Heart Rate (bpm): 144   Movement: Present     General:  Alert, oriented and cooperative. Patient is in no acute distress.  Skin: Skin is warm and dry. No rash noted.   Cardiovascular: Normal heart rate noted  Respiratory: Normal  respiratory effort, no problems with respiration noted  Abdomen: Soft, gravid, appropriate for gestational age.  Pain/Pressure: Absent     Pelvic: Cervical exam deferred        Extremities: Normal range of motion.  Edema: None  Mental Status: Normal mood and affect. Normal behavior. Normal judgment and thought content.   Assessment and Plan:  Pregnancy: G2P0101 at [redacted]w[redacted]d  1. Chronic hypertension affecting pregnancy Well controlled on procardia On ASA [place] for growth planned  2. Diet controlled gestational diabetes mellitus (GDM) in third trimester [place]  88-99 (5 of 14 elevated) 2hr89-117 (all in range)  Has antenatal survellience scheduled for weekly BPPs at [facility] Has EFW at 36 wk  3. H/O cesarean section complicating pregnancy Desires TOLAC, signed consent  4. History of fetal anomaly in prior pregnancy, currently pregnant Normal anatomy today  5. History of postpartum hemorrhage, currently pregnant LD aware  6. Supervision of high risk pregnancy, antepartum Doing well Reported some "teary and blurry vision" occasionally, did not associated with other sx. Encouraged to monitor the symptom. Did endorse this would happen prior to pregnancy as well Encouraged to find notary to help with financial cost of antenatal surveillance- reviewed possible available places (local postal shop, funeral parlors, health department). Discussed that BPP cannot be done at [facility]  Preterm labor symptoms and general obstetric precautions including but not limited to vaginal bleeding, contractions, leaking of fluid and fetal movement were reviewed in detail with the patient. Please refer to After Visit Summary for other counseling recommendations.   Return in about 2 weeks (around 04/19/2020) for Routine prenatal care.  Future Appointments  Date Time Provider [place]  04/14/2020  8:00 AM [facility] NURSE [facility]  04/14/2020  8:15 AM [facility] US2  [facility]  04/18/2020  8:15 AM  Aletha Halim, MD [facility]  04/18/2020  9:30 AM [facility] NURSE [facility]  04/18/2020  9:45 AM [facility] US4 [facility]  04/25/2020  8:15 AM [facility] NURSE [facility]  04/25/2020  8:30 AM [facility] US3 [facility]  05/02/2020  8:30 AM [facility] NURSE [facility]  05/02/2020  8:45 AM [facility] US5 [facility]  05/02/2020 11:15 AM Anyanwu, Sallyanne Havers, MD [facility]  05/09/2020 11:00 AM Donnamae Jude, MD [facility]  05/16/2020 11:00 AM Anyanwu, Sallyanne Havers, MD [facility]    Caren Macadam, MD

## 2020-04-14 ENCOUNTER — Other Ambulatory Visit: Payer: Self-pay

## 2020-04-14 ENCOUNTER — Encounter: Payer: Self-pay | Admitting: *Deleted

## 2020-04-14 ENCOUNTER — Ambulatory Visit: Payer: Self-pay | Attending: Obstetrics and Gynecology

## 2020-04-14 ENCOUNTER — Ambulatory Visit: Payer: Self-pay | Admitting: *Deleted

## 2020-04-14 DIAGNOSIS — D259 Leiomyoma of uterus, unspecified: Secondary | ICD-10-CM

## 2020-04-14 DIAGNOSIS — O34219 Maternal care for unspecified type scar from previous cesarean delivery: Secondary | ICD-10-CM

## 2020-04-14 DIAGNOSIS — O24410 Gestational diabetes mellitus in pregnancy, diet controlled: Secondary | ICD-10-CM

## 2020-04-14 DIAGNOSIS — O3413 Maternal care for benign tumor of corpus uteri, third trimester: Secondary | ICD-10-CM

## 2020-04-14 DIAGNOSIS — O10913 Unspecified pre-existing hypertension complicating pregnancy, third trimester: Secondary | ICD-10-CM

## 2020-04-14 DIAGNOSIS — O10919 Unspecified pre-existing hypertension complicating pregnancy, unspecified trimester: Secondary | ICD-10-CM | POA: Insufficient documentation

## 2020-04-14 DIAGNOSIS — O09293 Supervision of pregnancy with other poor reproductive or obstetric history, third trimester: Secondary | ICD-10-CM

## 2020-04-14 DIAGNOSIS — O358XX0 Maternal care for other (suspected) fetal abnormality and damage, not applicable or unspecified: Secondary | ICD-10-CM

## 2020-04-14 DIAGNOSIS — Z3A32 32 weeks gestation of pregnancy: Secondary | ICD-10-CM

## 2020-04-14 DIAGNOSIS — O4443 Low lying placenta NOS or without hemorrhage, third trimester: Secondary | ICD-10-CM

## 2020-04-18 ENCOUNTER — Other Ambulatory Visit: Payer: Self-pay | Admitting: *Deleted

## 2020-04-18 ENCOUNTER — Ambulatory Visit (INDEPENDENT_AMBULATORY_CARE_PROVIDER_SITE_OTHER): Payer: Self-pay | Admitting: Obstetrics and Gynecology

## 2020-04-18 ENCOUNTER — Ambulatory Visit: Payer: Self-pay | Attending: Obstetrics and Gynecology

## 2020-04-18 ENCOUNTER — Encounter: Payer: Self-pay | Admitting: *Deleted

## 2020-04-18 ENCOUNTER — Other Ambulatory Visit: Payer: Self-pay

## 2020-04-18 ENCOUNTER — Ambulatory Visit: Payer: Self-pay | Admitting: *Deleted

## 2020-04-18 VITALS — BP 131/83 | HR 89 | Wt 163.0 lb

## 2020-04-18 DIAGNOSIS — O10919 Unspecified pre-existing hypertension complicating pregnancy, unspecified trimester: Secondary | ICD-10-CM | POA: Insufficient documentation

## 2020-04-18 DIAGNOSIS — O34219 Maternal care for unspecified type scar from previous cesarean delivery: Secondary | ICD-10-CM | POA: Insufficient documentation

## 2020-04-18 DIAGNOSIS — O0990 Supervision of high risk pregnancy, unspecified, unspecified trimester: Secondary | ICD-10-CM

## 2020-04-18 DIAGNOSIS — O3413 Maternal care for benign tumor of corpus uteri, third trimester: Secondary | ICD-10-CM

## 2020-04-18 DIAGNOSIS — D259 Leiomyoma of uterus, unspecified: Secondary | ICD-10-CM

## 2020-04-18 DIAGNOSIS — O24410 Gestational diabetes mellitus in pregnancy, diet controlled: Secondary | ICD-10-CM

## 2020-04-18 DIAGNOSIS — O3410 Maternal care for benign tumor of corpus uteri, unspecified trimester: Secondary | ICD-10-CM

## 2020-04-18 DIAGNOSIS — E876 Hypokalemia: Secondary | ICD-10-CM

## 2020-04-18 DIAGNOSIS — Z789 Other specified health status: Secondary | ICD-10-CM

## 2020-04-18 DIAGNOSIS — O322XX0 Maternal care for transverse and oblique lie, not applicable or unspecified: Secondary | ICD-10-CM

## 2020-04-18 DIAGNOSIS — Z3A33 33 weeks gestation of pregnancy: Secondary | ICD-10-CM

## 2020-04-18 DIAGNOSIS — O10913 Unspecified pre-existing hypertension complicating pregnancy, third trimester: Secondary | ICD-10-CM

## 2020-04-18 DIAGNOSIS — Z8751 Personal history of pre-term labor: Secondary | ICD-10-CM

## 2020-04-18 DIAGNOSIS — O09523 Supervision of elderly multigravida, third trimester: Secondary | ICD-10-CM

## 2020-04-18 DIAGNOSIS — O4443 Low lying placenta NOS or without hemorrhage, third trimester: Secondary | ICD-10-CM

## 2020-04-18 DIAGNOSIS — O358XX0 Maternal care for other (suspected) fetal abnormality and damage, not applicable or unspecified: Secondary | ICD-10-CM

## 2020-04-18 DIAGNOSIS — O09293 Supervision of pregnancy with other poor reproductive or obstetric history, third trimester: Secondary | ICD-10-CM

## 2020-04-18 NOTE — Progress Notes (Addendum)
Prenatal Visit Note Date: 04/18/2020 Clinic: [facility]  Subjective:  Cassandra Stokes is a 39 y.o. G2P0101 at [redacted]w[redacted]d being seen today for ongoing prenatal care.  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  Patient reports no complaints.   Contractions: Not present. Vag. Bleeding: None.  Movement: Present. Denies leaking of fluid.   The following portions of the patient's history were reviewed and updated as appropriate: allergies, current medications, past family history, past medical history, past social history, past surgical history and problem list. Problem list updated.  Objective:   Vitals:   04/18/20 0832  BP: 131/83  Pulse: 89  Weight: 163 lb (73.9 kg)    Fetal Status: Fetal Heart Rate (bpm): 136   Movement: Present     General:  Alert, oriented and cooperative. Patient is in no acute distress.  Skin: Skin is warm and dry. No rash noted.   Cardiovascular: Normal heart rate noted  Respiratory: Normal respiratory effort, no problems with respiration noted  Abdomen: Soft, gravid, appropriate for gestational age. Pain/Pressure: Present     Pelvic:  Cervical exam deferred        Extremities: Normal range of motion.   Edema: Trace  Mental Status: Normal mood and affect. Normal behavior. Normal judgment and thought content.   Urinalysis:      Assessment and Plan:  Pregnancy: G2P0101 at [redacted]w[redacted]d  1. Chronic hypertension affecting pregnancy Doing well on procardia 30 qday and low dose ASA Has qwk bpp today 11/30 growth efw 86% and ac 99%  2. Uterine fibroid in pregnancy No issues  3. Language barrier Interpreter used  4. History of preterm delivery No interventions appropriate this pregnancy  5. AMA (advanced maternal age) multigravida 72+, third trimester No issues  6. Supervision of high risk pregnancy, antepartum D/w her re: BC more nv  7. [redacted] weeks gestation of pregnancy  8. H/O cesarean section complicating pregnancy tolac consent already signed. Pt desires tolac  9. Hypokalemia Rpt bmp nv  10. GDMa1 Normal CBG log  Preterm labor symptoms and general obstetric precautions including but not limited to vaginal bleeding, contractions, leaking of fluid and fetal movement were reviewed in detail with the patient. Please refer to After Visit Summary for other counseling recommendations.  RTC: 1wk bpp with mfm and wk hrob   Aletha Halim, MD

## 2020-04-25 ENCOUNTER — Ambulatory Visit: Payer: Self-pay

## 2020-04-25 ENCOUNTER — Ambulatory Visit: Payer: Self-pay | Attending: Obstetrics and Gynecology

## 2020-05-02 ENCOUNTER — Ambulatory Visit: Payer: Self-pay

## 2020-05-02 ENCOUNTER — Encounter: Payer: Self-pay | Admitting: Obstetrics & Gynecology

## 2020-05-02 ENCOUNTER — Ambulatory Visit: Payer: Self-pay | Attending: Obstetrics and Gynecology

## 2020-05-09 ENCOUNTER — Ambulatory Visit (INDEPENDENT_AMBULATORY_CARE_PROVIDER_SITE_OTHER): Payer: Self-pay | Admitting: Family Medicine

## 2020-05-09 ENCOUNTER — Other Ambulatory Visit (HOSPITAL_COMMUNITY)
Admission: RE | Admit: 2020-05-09 | Discharge: 2020-05-09 | Disposition: A | Discharge status: Home / Self Care | Payer: Self-pay | Source: Ambulatory Visit | Attending: Family Medicine | Admitting: Family Medicine

## 2020-05-09 ENCOUNTER — Other Ambulatory Visit: Payer: Self-pay

## 2020-05-09 ENCOUNTER — Encounter: Payer: Self-pay | Admitting: Family Medicine

## 2020-05-09 VITALS — BP 147/89 | HR 82 | Wt 166.0 lb

## 2020-05-09 DIAGNOSIS — O09523 Supervision of elderly multigravida, third trimester: Secondary | ICD-10-CM

## 2020-05-09 DIAGNOSIS — O0990 Supervision of high risk pregnancy, unspecified, unspecified trimester: Secondary | ICD-10-CM | POA: Insufficient documentation

## 2020-05-09 DIAGNOSIS — O24410 Gestational diabetes mellitus in pregnancy, diet controlled: Secondary | ICD-10-CM

## 2020-05-09 DIAGNOSIS — Z8751 Personal history of pre-term labor: Secondary | ICD-10-CM

## 2020-05-09 DIAGNOSIS — O10919 Unspecified pre-existing hypertension complicating pregnancy, unspecified trimester: Secondary | ICD-10-CM

## 2020-05-09 DIAGNOSIS — Z8759 Personal history of other complications of pregnancy, childbirth and the puerperium: Secondary | ICD-10-CM

## 2020-05-09 DIAGNOSIS — O34219 Maternal care for unspecified type scar from previous cesarean delivery: Secondary | ICD-10-CM

## 2020-05-09 DIAGNOSIS — Z789 Other specified health status: Secondary | ICD-10-CM

## 2020-05-09 DIAGNOSIS — O322XX0 Maternal care for transverse and oblique lie, not applicable or unspecified: Secondary | ICD-10-CM | POA: Insufficient documentation

## 2020-05-09 NOTE — Patient Instructions (Signed)

## 2020-05-09 NOTE — Progress Notes (Signed)
PRENATAL VISIT NOTE  Subjective:  Cassandra Stokes is a 40 y.o. G2P0101 at [redacted]w[redacted]d being seen today for ongoing prenatal care.  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  Patient reports no complaints.  Contractions: Irregular. Vag. Bleeding: None.  Movement: Present. Denies leaking of fluid.   The following portions of the patient's history were reviewed and updated as appropriate: allergies, current medications, past family history, past medical history, past social history, past surgical history and problem list.   Objective:   Vitals:   05/09/20 1102  BP: (!) 147/89  Pulse: 82  Weight: 166 lb (75.3 kg)    Fetal Status: Fetal Heart Rate (bpm): 136   Movement: Present     General:  Alert, oriented and cooperative. Patient is in no acute distress.  Skin: Skin is warm and dry. No rash noted.   Cardiovascular: Normal heart rate noted  Respiratory: Normal respiratory effort, no problems with respiration noted  Abdomen: Soft, gravid, appropriate for gestational age.  Pain/Pressure: Present     Pelvic: Cervical exam performed in the presence of a chaperone      unable to determine, no presenting part in the pelvis   Extremities: Normal range of motion.  Edema: Trace  Mental Status: Normal mood and affect. Normal behavior. Normal judgment and thought content.   Assessment and Plan:  Pregnancy: G2P0101 at [redacted]w[redacted]d 1. Supervision of high risk pregnancy, antepartum Cultures today - Strep Gp B NAA - Cervicovaginal ancillary only( [place])  2. Chronic hypertension affecting pregnancy Did not take her Procardia yet today  3. GDM (gestational diabetes mellitus), class A1 No book Per pt. Report, pp are in the 112 or less range Fasting today was 87  4. History of severe pre-eclampsia BP ok for now  5. Language barrier Spanish interpreter: Nohelia used   6. History of preterm delivery   7. AMA (advanced maternal age) multigravida 42+, third trimester Normal NIPT  8. H/O cesarean section complicating pregnancy Desires TOLAC  9. Transverse lie of fetus, single or unspecified fetus Offered ECV--she will consider--has u/s this week for testing, if still not vertex next week, will consider RCS delivery vs. ECV with IOL  Preterm labor symptoms and general obstetric precautions including but not limited to vaginal bleeding, contractions, leaking of fluid and fetal movement were reviewed in detail with the patient. Please refer to After Visit Summary for other counseling recommendations.   Return in about 1 week (around 05/16/2020) for in person, weekly BPP with MFM--has not been since 12/14, needs growth as well..  Future Appointments  Date Time Provider [place]  05/11/2020  3:30 PM [facility] NURSE [facility]  05/11/2020  3:45 PM [facility] US1 [facility] [place] [facility]  05/16/2020 11:00 AM Anyanwu, Sallyanne Havers, MD [facility]  05/18/2020  8:30 AM [facility] NURSE [facility]  05/18/2020  8:45 AM [facility] US4 [facility]  05/23/2020 11:00 AM Anyanwu, Sallyanne Havers, MD [facility]  05/30/2020 11:00 AM Miner Bing, MD [facility]    Reva Bores, MD

## 2020-05-10 LAB — CERVICOVAGINAL ANCILLARY ONLY
Chlamydia: NEGATIVE
Comment: NEGATIVE
Comment: NORMAL
Neisseria Gonorrhea: NEGATIVE

## 2020-05-11 ENCOUNTER — Ambulatory Visit: Payer: Self-pay | Admitting: *Deleted

## 2020-05-11 ENCOUNTER — Other Ambulatory Visit: Payer: Self-pay

## 2020-05-11 ENCOUNTER — Ambulatory Visit: Payer: Self-pay | Attending: Obstetrics and Gynecology

## 2020-05-11 ENCOUNTER — Other Ambulatory Visit: Payer: Self-pay | Admitting: Maternal & Fetal Medicine

## 2020-05-11 ENCOUNTER — Encounter: Payer: Self-pay | Admitting: *Deleted

## 2020-05-11 DIAGNOSIS — O4443 Low lying placenta NOS or without hemorrhage, third trimester: Secondary | ICD-10-CM

## 2020-05-11 DIAGNOSIS — O34219 Maternal care for unspecified type scar from previous cesarean delivery: Secondary | ICD-10-CM

## 2020-05-11 DIAGNOSIS — O10913 Unspecified pre-existing hypertension complicating pregnancy, third trimester: Secondary | ICD-10-CM | POA: Insufficient documentation

## 2020-05-11 DIAGNOSIS — O09293 Supervision of pregnancy with other poor reproductive or obstetric history, third trimester: Secondary | ICD-10-CM

## 2020-05-11 DIAGNOSIS — O358XX0 Maternal care for other (suspected) fetal abnormality and damage, not applicable or unspecified: Secondary | ICD-10-CM

## 2020-05-11 DIAGNOSIS — D259 Leiomyoma of uterus, unspecified: Secondary | ICD-10-CM

## 2020-05-11 DIAGNOSIS — Z3A36 36 weeks gestation of pregnancy: Secondary | ICD-10-CM | POA: Insufficient documentation

## 2020-05-11 DIAGNOSIS — O3413 Maternal care for benign tumor of corpus uteri, third trimester: Secondary | ICD-10-CM

## 2020-05-11 DIAGNOSIS — O24410 Gestational diabetes mellitus in pregnancy, diet controlled: Secondary | ICD-10-CM

## 2020-05-11 DIAGNOSIS — O322XX0 Maternal care for transverse and oblique lie, not applicable or unspecified: Secondary | ICD-10-CM

## 2020-05-11 NOTE — Progress Notes (Unsigned)
Dr. Grace Bushy recommended (using spanish interp),  pt to go to MAU for further evaluation due to BP 164/102 and BP trending upwards.  Pt reports good fetal movement, denies any headache,  or  Visual changes.  She states, "I feel fine".  Dr. Grace Bushy explain reasons pt should go to hospital & possible negative outcomes.  I offered transportation with transportation services since she was concerned her ride was going to leave her. Pt was very irritated when she got up and left the room to leave.

## 2020-05-12 ENCOUNTER — Encounter (HOSPITAL_COMMUNITY): Payer: Self-pay | Admitting: Family Medicine

## 2020-05-12 ENCOUNTER — Inpatient Hospital Stay (HOSPITAL_COMMUNITY)
Admission: AD | Admit: 2020-05-12 | Discharge: 2020-05-15 | DRG: 787 | Disposition: A | Discharge status: Home / Self Care | Payer: Medicaid Other | Attending: Family Medicine | Admitting: Family Medicine

## 2020-05-12 ENCOUNTER — Inpatient Hospital Stay (HOSPITAL_COMMUNITY): Payer: Medicaid Other | Admitting: Certified Registered"

## 2020-05-12 ENCOUNTER — Other Ambulatory Visit: Payer: Self-pay

## 2020-05-12 ENCOUNTER — Encounter (HOSPITAL_COMMUNITY)
Admission: AD | Disposition: A | Discharge status: Home / Self Care | Payer: Self-pay | Source: Home / Self Care | Attending: Family Medicine

## 2020-05-12 DIAGNOSIS — O34211 Maternal care for low transverse scar from previous cesarean delivery: Secondary | ICD-10-CM | POA: Diagnosis present

## 2020-05-12 DIAGNOSIS — I10 Essential (primary) hypertension: Secondary | ICD-10-CM | POA: Diagnosis present

## 2020-05-12 DIAGNOSIS — O3442 Maternal care for other abnormalities of cervix, second trimester: Secondary | ICD-10-CM | POA: Diagnosis present

## 2020-05-12 DIAGNOSIS — O3413 Maternal care for benign tumor of corpus uteri, third trimester: Secondary | ICD-10-CM | POA: Diagnosis present

## 2020-05-12 DIAGNOSIS — O114 Pre-existing hypertension with pre-eclampsia, complicating childbirth: Secondary | ICD-10-CM | POA: Diagnosis present

## 2020-05-12 DIAGNOSIS — D259 Leiomyoma of uterus, unspecified: Secondary | ICD-10-CM | POA: Diagnosis present

## 2020-05-12 DIAGNOSIS — O24430 Gestational diabetes mellitus in the puerperium, diet controlled: Secondary | ICD-10-CM | POA: Diagnosis not present

## 2020-05-12 DIAGNOSIS — O24410 Gestational diabetes mellitus in pregnancy, diet controlled: Secondary | ICD-10-CM | POA: Diagnosis present

## 2020-05-12 DIAGNOSIS — O09523 Supervision of elderly multigravida, third trimester: Secondary | ICD-10-CM | POA: Diagnosis present

## 2020-05-12 DIAGNOSIS — O1093 Unspecified pre-existing hypertension complicating the puerperium: Secondary | ICD-10-CM | POA: Diagnosis not present

## 2020-05-12 DIAGNOSIS — O283 Abnormal ultrasonic finding on antenatal screening of mother: Secondary | ICD-10-CM | POA: Diagnosis present

## 2020-05-12 DIAGNOSIS — Z20822 Contact with and (suspected) exposure to covid-19: Secondary | ICD-10-CM | POA: Diagnosis present

## 2020-05-12 DIAGNOSIS — O24420 Gestational diabetes mellitus in childbirth, diet controlled: Secondary | ICD-10-CM | POA: Diagnosis present

## 2020-05-12 DIAGNOSIS — R87612 Low grade squamous intraepithelial lesion on cytologic smear of cervix (LGSIL): Secondary | ICD-10-CM | POA: Diagnosis present

## 2020-05-12 DIAGNOSIS — D649 Anemia, unspecified: Secondary | ICD-10-CM | POA: Diagnosis not present

## 2020-05-12 DIAGNOSIS — D62 Acute posthemorrhagic anemia: Secondary | ICD-10-CM | POA: Diagnosis not present

## 2020-05-12 DIAGNOSIS — Z8759 Personal history of other complications of pregnancy, childbirth and the puerperium: Secondary | ICD-10-CM

## 2020-05-12 DIAGNOSIS — O9903 Anemia complicating the puerperium: Secondary | ICD-10-CM | POA: Diagnosis not present

## 2020-05-12 DIAGNOSIS — O1002 Pre-existing essential hypertension complicating childbirth: Secondary | ICD-10-CM | POA: Diagnosis present

## 2020-05-12 DIAGNOSIS — Z789 Other specified health status: Secondary | ICD-10-CM | POA: Diagnosis present

## 2020-05-12 DIAGNOSIS — O115 Pre-existing hypertension with pre-eclampsia, complicating the puerperium: Secondary | ICD-10-CM | POA: Diagnosis not present

## 2020-05-12 DIAGNOSIS — O26893 Other specified pregnancy related conditions, third trimester: Secondary | ICD-10-CM | POA: Diagnosis present

## 2020-05-12 DIAGNOSIS — Z8751 Personal history of pre-term labor: Secondary | ICD-10-CM

## 2020-05-12 DIAGNOSIS — O3410 Maternal care for benign tumor of corpus uteri, unspecified trimester: Secondary | ICD-10-CM | POA: Diagnosis present

## 2020-05-12 DIAGNOSIS — O322XX0 Maternal care for transverse and oblique lie, not applicable or unspecified: Secondary | ICD-10-CM | POA: Diagnosis present

## 2020-05-12 DIAGNOSIS — O0990 Supervision of high risk pregnancy, unspecified, unspecified trimester: Secondary | ICD-10-CM

## 2020-05-12 DIAGNOSIS — O321XX0 Maternal care for breech presentation, not applicable or unspecified: Principal | ICD-10-CM | POA: Diagnosis present

## 2020-05-12 DIAGNOSIS — Z3A36 36 weeks gestation of pregnancy: Secondary | ICD-10-CM

## 2020-05-12 DIAGNOSIS — O9081 Anemia of the puerperium: Secondary | ICD-10-CM | POA: Diagnosis not present

## 2020-05-12 DIAGNOSIS — O34219 Maternal care for unspecified type scar from previous cesarean delivery: Secondary | ICD-10-CM | POA: Diagnosis present

## 2020-05-12 DIAGNOSIS — O10919 Unspecified pre-existing hypertension complicating pregnancy, unspecified trimester: Secondary | ICD-10-CM | POA: Diagnosis present

## 2020-05-12 DIAGNOSIS — O09299 Supervision of pregnancy with other poor reproductive or obstetric history, unspecified trimester: Secondary | ICD-10-CM

## 2020-05-12 LAB — COMPREHENSIVE METABOLIC PANEL
ALT: 14 U/L (ref 0–44)
AST: 22 U/L (ref 15–41)
Albumin: 3 g/dL — ABNORMAL LOW (ref 3.5–5.0)
Alkaline Phosphatase: 172 U/L — ABNORMAL HIGH (ref 38–126)
Anion gap: 12 (ref 5–15)
BUN: <5 mg/dL — ABNORMAL LOW (ref 6–20)
CO2: 19 mmol/L — ABNORMAL LOW (ref 22–32)
Calcium: 9 mg/dL (ref 8.9–10.3)
Chloride: 107 mmol/L (ref 98–111)
Creatinine, Ser: 0.48 mg/dL (ref 0.44–1.00)
GFR, Estimated: >60 mL/min (ref >60)
Glucose, Bld: 117 mg/dL — ABNORMAL HIGH (ref 70–99)
Potassium: 3.6 mmol/L (ref 3.5–5.1)
Sodium: 138 mmol/L (ref 135–145)
Total Bilirubin: 0.5 mg/dL (ref 0.3–1.2)
Total Protein: 6.6 g/dL (ref 6.5–8.1)

## 2020-05-12 LAB — CBC
HCT: 36.9 % (ref 36.0–46.0)
Hemoglobin: 12.1 g/dL (ref 12.0–15.0)
MCH: 29.1 pg (ref 26.0–34.0)
MCHC: 32.8 g/dL (ref 30.0–36.0)
MCV: 88.7 fL (ref 80.0–100.0)
Platelets: 299 10*3/uL (ref 150–400)
RBC: 4.16 MIL/uL (ref 3.87–5.11)
RDW: 12.9 % (ref 11.5–15.5)
WBC: 10.2 10*3/uL (ref 4.0–10.5)
nRBC: 0 % (ref 0.0–0.2)

## 2020-05-12 LAB — TYPE AND SCREEN
ABO/RH(D): O POS
Antibody Screen: NEGATIVE

## 2020-05-12 LAB — RESP PANEL BY RT-PCR (RSV, FLU A&B, COVID)  RVPGX2
Influenza A by PCR: NEGATIVE
Influenza B by PCR: NEGATIVE
Resp Syncytial Virus by PCR: NEGATIVE
SARS Coronavirus 2 by RT PCR: NEGATIVE

## 2020-05-12 LAB — GLUCOSE, CAPILLARY: Glucose-Capillary: 122 mg/dL — ABNORMAL HIGH (ref 70–99)

## 2020-05-12 SURGERY — Surgical Case
Anesthesia: Spinal

## 2020-05-12 MED ORDER — BUPIVACAINE IN DEXTROSE 0.75-8.25 % IT SOLN
INTRATHECAL | Status: DC | PRN
  Administered 2020-05-12: 1.6 mL via INTRATHECAL

## 2020-05-12 MED ORDER — DIPHENHYDRAMINE HCL 25 MG PO CAPS: 25.0000 mg | ORAL_CAPSULE | ORAL | Status: DC | PRN

## 2020-05-12 MED ORDER — NALBUPHINE HCL 10 MG/ML IJ SOLN: 5.0000 mg | INTRAMUSCULAR | Status: DC | PRN

## 2020-05-12 MED ORDER — COCONUT OIL OIL: 1.0000 "application " | TOPICAL_OIL | Status: DC | PRN

## 2020-05-12 MED ORDER — IBUPROFEN 800 MG PO TABS
800.0000 mg | ORAL_TABLET | Freq: Four times a day (QID) | ORAL | Status: DC
  Administered 2020-05-13 – 2020-05-15 (×9): 800 mg via ORAL
  Filled 2020-05-12 (×9): qty 1

## 2020-05-12 MED ORDER — FENTANYL CITRATE (PF) 100 MCG/2ML IJ SOLN: 25.0000 ug | INTRAMUSCULAR | Status: DC | PRN

## 2020-05-12 MED ORDER — ACETAMINOPHEN 500 MG PO TABS
1000.0000 mg | ORAL_TABLET | Freq: Four times a day (QID) | ORAL | Status: AC
  Administered 2020-05-12 – 2020-05-13 (×4): 1000 mg via ORAL
  Filled 2020-05-12 (×4): qty 2

## 2020-05-12 MED ORDER — OXYTOCIN-SODIUM CHLORIDE 30-0.9 UT/500ML-% IV SOLN
INTRAVENOUS | Status: DC | PRN
  Administered 2020-05-12: 30 [IU] via INTRAVENOUS

## 2020-05-12 MED ORDER — METOCLOPRAMIDE HCL 5 MG/ML IJ SOLN
INTRAMUSCULAR | Status: AC
  Filled 2020-05-12: qty 2

## 2020-05-12 MED ORDER — DEXAMETHASONE SODIUM PHOSPHATE 10 MG/ML IJ SOLN
INTRAMUSCULAR | Status: DC | PRN
  Administered 2020-05-12: 5 mg via INTRAVENOUS

## 2020-05-12 MED ORDER — PHENYLEPHRINE HCL-NACL 20-0.9 MG/250ML-% IV SOLN
INTRAVENOUS | Status: AC
  Filled 2020-05-12: qty 250

## 2020-05-12 MED ORDER — DIBUCAINE (PERIANAL) 1 % EX OINT
1.0000 | TOPICAL_OINTMENT | CUTANEOUS | Status: DC | PRN
Start: 2020-05-12 — End: 2020-05-15

## 2020-05-12 MED ORDER — CEFAZOLIN SODIUM-DEXTROSE 2-3 GM-%(50ML) IV SOLR
INTRAVENOUS | Status: DC | PRN
  Administered 2020-05-12: 2 g via INTRAVENOUS

## 2020-05-12 MED ORDER — ENOXAPARIN SODIUM 40 MG/0.4ML ~~LOC~~ SOLN
40.0000 mg | SUBCUTANEOUS | Status: DC
  Administered 2020-05-13 – 2020-05-15 (×3): 40 mg via SUBCUTANEOUS
  Filled 2020-05-12 (×3): qty 0.4

## 2020-05-12 MED ORDER — WITCH HAZEL-GLYCERIN EX PADS: 1.0000 "application " | MEDICATED_PAD | CUTANEOUS | Status: DC | PRN

## 2020-05-12 MED ORDER — METOCLOPRAMIDE HCL 5 MG/ML IJ SOLN
INTRAMUSCULAR | Status: DC | PRN
  Administered 2020-05-12: 5 mg via INTRAVENOUS

## 2020-05-12 MED ORDER — ONDANSETRON HCL 4 MG/2ML IJ SOLN
INTRAMUSCULAR | Status: DC | PRN
  Administered 2020-05-12: 4 mg via INTRAVENOUS

## 2020-05-12 MED ORDER — SENNOSIDES-DOCUSATE SODIUM 8.6-50 MG PO TABS
2.0000 | ORAL_TABLET | Freq: Every day | ORAL | Status: DC
  Administered 2020-05-12 – 2020-05-14 (×3): 2 via ORAL
  Filled 2020-05-12 (×3): qty 2

## 2020-05-12 MED ORDER — PRENATAL MULTIVITAMIN CH
1.0000 | ORAL_TABLET | Freq: Every day | ORAL | Status: DC
  Administered 2020-05-13 – 2020-05-15 (×3): 1 via ORAL
  Filled 2020-05-12 (×3): qty 1

## 2020-05-12 MED ORDER — MENTHOL 3 MG MT LOZG: 1.0000 | LOZENGE | OROMUCOSAL | Status: DC | PRN

## 2020-05-12 MED ORDER — KETOROLAC TROMETHAMINE 30 MG/ML IJ SOLN
30.0000 mg | Freq: Four times a day (QID) | INTRAMUSCULAR | Status: AC
  Administered 2020-05-12 – 2020-05-13 (×4): 30 mg via INTRAVENOUS
  Filled 2020-05-12 (×4): qty 1

## 2020-05-12 MED ORDER — SOD CITRATE-CITRIC ACID 500-334 MG/5ML PO SOLN: 30.0000 mL | ORAL | Status: DC

## 2020-05-12 MED ORDER — ONDANSETRON HCL 4 MG/2ML IJ SOLN
4.0000 mg | Freq: Three times a day (TID) | INTRAMUSCULAR | Status: DC | PRN
Start: 2020-05-12 — End: 2020-05-15

## 2020-05-12 MED ORDER — SIMETHICONE 80 MG PO CHEW
80.0000 mg | CHEWABLE_TABLET | Freq: Three times a day (TID) | ORAL | Status: DC
  Administered 2020-05-12 – 2020-05-15 (×9): 80 mg via ORAL
  Filled 2020-05-12 (×9): qty 1

## 2020-05-12 MED ORDER — FENTANYL CITRATE (PF) 100 MCG/2ML IJ SOLN
INTRAMUSCULAR | Status: DC | PRN
  Administered 2020-05-12: 15 ug via INTRATHECAL

## 2020-05-12 MED ORDER — FENTANYL CITRATE (PF) 100 MCG/2ML IJ SOLN
INTRAMUSCULAR | Status: AC
  Filled 2020-05-12: qty 2

## 2020-05-12 MED ORDER — SODIUM CHLORIDE 0.9 % IV SOLN: INTRAVENOUS | Status: DC | PRN

## 2020-05-12 MED ORDER — TETANUS-DIPHTH-ACELL PERTUSSIS 5-2.5-18.5 LF-MCG/0.5 IM SUSY: 0.5000 mL | PREFILLED_SYRINGE | Freq: Once | INTRAMUSCULAR | Status: DC

## 2020-05-12 MED ORDER — OXYTOCIN-SODIUM CHLORIDE 30-0.9 UT/500ML-% IV SOLN
INTRAVENOUS | Status: AC
  Filled 2020-05-12: qty 500

## 2020-05-12 MED ORDER — KETOROLAC TROMETHAMINE 30 MG/ML IJ SOLN: 30.0000 mg | Freq: Once | INTRAMUSCULAR | Status: DC

## 2020-05-12 MED ORDER — NIFEDIPINE ER OSMOTIC RELEASE 30 MG PO TB24
30.0000 mg | ORAL_TABLET | Freq: Every day | ORAL | Status: DC
  Administered 2020-05-12 – 2020-05-15 (×4): 30 mg via ORAL
  Filled 2020-05-12 (×4): qty 1

## 2020-05-12 MED ORDER — SODIUM CHLORIDE 0.9 % IV SOLN: 500.0000 mg | Freq: Once | INTRAVENOUS | Status: DC

## 2020-05-12 MED ORDER — SODIUM CHLORIDE 0.9% FLUSH: 3.0000 mL | INTRAVENOUS | Status: DC | PRN

## 2020-05-12 MED ORDER — MEASLES, MUMPS & RUBELLA VAC IJ SOLR: 0.5000 mL | Freq: Once | INTRAMUSCULAR | Status: DC

## 2020-05-12 MED ORDER — NALBUPHINE HCL 10 MG/ML IJ SOLN
5.0000 mg | Freq: Once | INTRAMUSCULAR | Status: AC | PRN
Start: 2020-05-12 — End: 2020-05-12
  Administered 2020-05-12: 5 mg via INTRAVENOUS
  Filled 2020-05-12: qty 1

## 2020-05-12 MED ORDER — KETOROLAC TROMETHAMINE 30 MG/ML IJ SOLN
INTRAMUSCULAR | Status: AC
  Filled 2020-05-12: qty 1

## 2020-05-12 MED ORDER — SENNOSIDES-DOCUSATE SODIUM 8.6-50 MG PO TABS: 2.0000 | ORAL_TABLET | ORAL | Status: DC

## 2020-05-12 MED ORDER — MORPHINE SULFATE (PF) 0.5 MG/ML IJ SOLN
INTRAMUSCULAR | Status: AC
  Filled 2020-05-12: qty 10

## 2020-05-12 MED ORDER — BUPIVACAINE HCL 0.25 % IJ SOLN
INTRAMUSCULAR | Status: DC | PRN
  Administered 2020-05-12: 30 mL

## 2020-05-12 MED ORDER — ACETAMINOPHEN 160 MG/5ML PO SOLN: 1000.0000 mg | Freq: Once | ORAL | Status: DC

## 2020-05-12 MED ORDER — KETOROLAC TROMETHAMINE 30 MG/ML IJ SOLN: 30.0000 mg | Freq: Four times a day (QID) | INTRAMUSCULAR | Status: DC | PRN

## 2020-05-12 MED ORDER — NALBUPHINE HCL 10 MG/ML IJ SOLN: 5.0000 mg | Freq: Once | INTRAMUSCULAR | Status: AC | PRN

## 2020-05-12 MED ORDER — ACETAMINOPHEN 500 MG PO TABS: 1000.0000 mg | ORAL_TABLET | Freq: Once | ORAL | Status: DC

## 2020-05-12 MED ORDER — MORPHINE SULFATE (PF) 0.5 MG/ML IJ SOLN
INTRAMUSCULAR | Status: DC | PRN
  Administered 2020-05-12: .15 mg via INTRATHECAL

## 2020-05-12 MED ORDER — SIMETHICONE 80 MG PO CHEW: 80.0000 mg | CHEWABLE_TABLET | ORAL | Status: DC | PRN

## 2020-05-12 MED ORDER — OXYTOCIN-SODIUM CHLORIDE 30-0.9 UT/500ML-% IV SOLN
2.5000 [IU]/h | INTRAVENOUS | Status: AC
  Administered 2020-05-12: 2.5 [IU]/h via INTRAVENOUS
  Filled 2020-05-12: qty 500

## 2020-05-12 MED ORDER — NALOXONE HCL 0.4 MG/ML IJ SOLN: 0.4000 mg | INTRAMUSCULAR | Status: DC | PRN

## 2020-05-12 MED ORDER — LACTATED RINGERS IV SOLN: INTRAVENOUS | Status: DC

## 2020-05-12 MED ORDER — SOD CITRATE-CITRIC ACID 500-334 MG/5ML PO SOLN
ORAL | Status: AC
  Administered 2020-05-12: 30 mL
  Filled 2020-05-12: qty 15

## 2020-05-12 MED ORDER — SODIUM CHLORIDE 0.9 % IV SOLN
INTRAVENOUS | Status: AC
  Filled 2020-05-12: qty 500

## 2020-05-12 MED ORDER — CEFAZOLIN SODIUM-DEXTROSE 2-4 GM/100ML-% IV SOLN: 2.0000 g | INTRAVENOUS | Status: DC

## 2020-05-12 MED ORDER — NALOXONE HCL 4 MG/10ML IJ SOLN
1.0000 ug/kg/h | INTRAMUSCULAR | Status: DC | PRN
  Filled 2020-05-12: qty 5

## 2020-05-12 MED ORDER — DIPHENHYDRAMINE HCL 25 MG PO CAPS: 25.0000 mg | ORAL_CAPSULE | Freq: Four times a day (QID) | ORAL | Status: DC | PRN

## 2020-05-12 MED ORDER — SODIUM CHLORIDE 0.9 % IV SOLN
INTRAVENOUS | Status: DC | PRN
  Administered 2020-05-12: 500 mg via INTRAVENOUS

## 2020-05-12 MED ORDER — BUPIVACAINE HCL (PF) 0.25 % IJ SOLN
INTRAMUSCULAR | Status: AC
  Filled 2020-05-12: qty 10

## 2020-05-12 MED ORDER — OXYCODONE HCL 5 MG PO TABS
5.0000 mg | ORAL_TABLET | ORAL | Status: DC | PRN
  Administered 2020-05-13 – 2020-05-14 (×2): 5 mg via ORAL
  Filled 2020-05-12 (×2): qty 1

## 2020-05-12 MED ORDER — KETOROLAC TROMETHAMINE 30 MG/ML IJ SOLN
30.0000 mg | Freq: Once | INTRAMUSCULAR | Status: AC
  Administered 2020-05-12: 30 mg via INTRAVENOUS

## 2020-05-12 MED ORDER — PROMETHAZINE HCL 25 MG/ML IJ SOLN: 6.2500 mg | INTRAMUSCULAR | Status: DC | PRN

## 2020-05-12 MED ORDER — DEXAMETHASONE SODIUM PHOSPHATE 10 MG/ML IJ SOLN
INTRAMUSCULAR | Status: AC
  Filled 2020-05-12: qty 1

## 2020-05-12 MED ORDER — BUPIVACAINE HCL (PF) 0.25 % IJ SOLN
INTRAMUSCULAR | Status: AC
  Filled 2020-05-12: qty 20

## 2020-05-12 MED ORDER — ONDANSETRON HCL 4 MG/2ML IJ SOLN
INTRAMUSCULAR | Status: AC
  Filled 2020-05-12: qty 2

## 2020-05-12 MED ORDER — LACTATED RINGERS IV SOLN: INTRAVENOUS | Status: DC | PRN

## 2020-05-12 MED ORDER — PHENYLEPHRINE HCL-NACL 20-0.9 MG/250ML-% IV SOLN
INTRAVENOUS | Status: DC | PRN
  Administered 2020-05-12: 60 ug/min via INTRAVENOUS

## 2020-05-12 MED ORDER — ACETAMINOPHEN 325 MG PO TABS
650.0000 mg | ORAL_TABLET | ORAL | Status: DC | PRN
  Administered 2020-05-13 – 2020-05-15 (×5): 650 mg via ORAL
  Filled 2020-05-12 (×5): qty 2

## 2020-05-12 MED ORDER — DIPHENHYDRAMINE HCL 50 MG/ML IJ SOLN: 12.5000 mg | INTRAMUSCULAR | Status: DC | PRN

## 2020-05-12 SURGICAL SUPPLY — 32 items
BENZOIN TINCTURE PRP APPL 2/3 (GAUZE/BANDAGES/DRESSINGS) ×2 IMPLANT
CLAMP CORD UMBIL (MISCELLANEOUS) ×2 IMPLANT
CLOTH BEACON ORANGE TIMEOUT ST (SAFETY) ×2 IMPLANT
DRSG OPSITE POSTOP 4X10 (GAUZE/BANDAGES/DRESSINGS) ×2 IMPLANT
ELECT REM PT RETURN 9FT ADLT (ELECTROSURGICAL) ×2
ELECTRODE REM PT RTRN 9FT ADLT (ELECTROSURGICAL) ×1 IMPLANT
EXTRACTOR VACUUM M CUP 4 TUBE (SUCTIONS) IMPLANT
GLOVE BIOGEL PI IND STRL 7.0 (GLOVE) ×3 IMPLANT
GLOVE BIOGEL PI INDICATOR 7.0 (GLOVE) ×3
GLOVE ECLIPSE 7.0 STRL STRAW (GLOVE) ×2 IMPLANT
GOWN STRL REUS W/TWL LRG LVL3 (GOWN DISPOSABLE) ×4 IMPLANT
KIT ABG SYR 3ML LUER SLIP (SYRINGE) ×2 IMPLANT
NEEDLE HYPO 22GX1.5 SAFETY (NEEDLE) ×2 IMPLANT
NEEDLE HYPO 25X5/8 SAFETYGLIDE (NEEDLE) ×2 IMPLANT
NS IRRIG 1000ML POUR BTL (IV SOLUTION) ×2 IMPLANT
PACK C SECTION WH (CUSTOM PROCEDURE TRAY) ×2 IMPLANT
PAD ABD 7.5X8 STRL (GAUZE/BANDAGES/DRESSINGS) ×2 IMPLANT
PAD OB MATERNITY 4.3X12.25 (PERSONAL CARE ITEMS) ×2 IMPLANT
PENCIL SMOKE EVAC W/HOLSTER (ELECTROSURGICAL) ×2 IMPLANT
RTRCTR C-SECT PINK 25CM LRG (MISCELLANEOUS) ×2 IMPLANT
STRIP CLOSURE SKIN 1/2X4 (GAUZE/BANDAGES/DRESSINGS) ×2 IMPLANT
SUT MNCRL 0 VIOLET CTX 36 (SUTURE) ×2 IMPLANT
SUT MONOCRYL 0 CTX 36 (SUTURE) ×2
SUT PLAIN 2 0 (SUTURE) ×1
SUT PLAIN ABS 2-0 CT1 27XMFL (SUTURE) ×1 IMPLANT
SUT VIC AB 0 CTX 36 (SUTURE) ×1
SUT VIC AB 0 CTX36XBRD ANBCTRL (SUTURE) ×1 IMPLANT
SUT VIC AB 4-0 KS 27 (SUTURE) ×2 IMPLANT
SYR 30ML LL (SYRINGE) ×2 IMPLANT
TOWEL OR 17X24 6PK STRL BLUE (TOWEL DISPOSABLE) ×2 IMPLANT
TRAY FOLEY W/BAG SLVR 14FR LF (SET/KITS/TRAYS/PACK) ×2 IMPLANT
WATER STERILE IRR 1000ML POUR (IV SOLUTION) ×2 IMPLANT

## 2020-05-12 NOTE — Anesthesia Preprocedure Evaluation (Signed)
Anesthesia Evaluation  Patient identified by MRN, date of birth, ID band Patient awake    Reviewed: Allergy & Precautions, NPO status , Patient's Chart, lab work & pertinent test results  History of Anesthesia Complications Negative for: history of anesthetic complications  Airway Mallampati: II  TM Distance: >3 FB Neck ROM: Full    Dental no notable dental hx.    Pulmonary neg pulmonary ROS,    Pulmonary exam normal        Cardiovascular hypertension, Normal cardiovascular exam     Neuro/Psych negative neurological ROS  negative psych ROS   GI/Hepatic negative GI ROS, Neg liver ROS,   Endo/Other  diabetes, Gestational  Renal/GU negative Renal ROS  negative genitourinary   Musculoskeletal negative musculoskeletal ROS (+)   Abdominal   Peds  Hematology negative hematology ROS (+)   Anesthesia Other Findings Day of surgery medications reviewed with patient.  Reproductive/Obstetrics (+) Pregnancy (Hx of C/S x1, breech presentation)                             Anesthesia Physical Anesthesia Plan  ASA: III and emergent  Anesthesia Plan: Spinal   Post-op Pain Management:    Induction:   PONV Risk Score and Plan: 4 or greater and Treatment may vary due to age or medical condition, Ondansetron and Dexamethasone  Airway Management Planned: Natural Airway  Additional Equipment: None  Intra-op Plan:   Post-operative Plan:   Informed Consent: I have reviewed the patients History and Physical, chart, labs and discussed the procedure including the risks, benefits and alternatives for the proposed anesthesia with the patient or authorized representative who has indicated his/her understanding and acceptance.     Interpreter used for [employer] Discussed with: CRNA  Anesthesia Plan Comments: (Patient in MAU with footling breech presentation, contracting with palpable foot at  cervix. Discussed case with Dr. Kennon Rounds who elects to wait for labs with plan for spinal anesthesia. If patient unable to sit up for spinal, will need GETA. Interpreter used to communicate this plan to patient who is in agreement. Daiva Huge, MD)        Anesthesia Quick Evaluation

## 2020-05-12 NOTE — Transfer of Care (Signed)
Immediate Anesthesia Transfer of Care Note  Patient: Cassandra Stokes  Procedure(s) Performed: CESAREAN SECTION  Patient Location: PACU  Anesthesia Type:Spinal  Level of Consciousness: awake and alert   Airway & Oxygen Therapy: Patient Spontanous Breathing  Post-op Assessment: Report given to RN  Post vital signs: Reviewed  Last Vitals:  Vitals Value Taken Time  BP 116/76 05/12/20 0723  Temp    Pulse 104 05/12/20 0728  Resp 22 05/12/20 0728  SpO2 100 % 05/12/20 0728  Vitals shown include unvalidated device data.  Last Pain:  Vitals:   05/12/20 0357  TempSrc:   PainSc: 7       Patients Stated Pain Goal: 0 (51/76/16 0737)  Complications: No complications documented.

## 2020-05-12 NOTE — H&P (Signed)
OBSTETRIC ADMISSION HISTORY AND PHYSICAL  Cassandra Stokes is a 40 y.o. female G2P0101 with IUP at 96w3dby 17wk u/s presenting for SOL. She reports +FMs, No LOF, no VB, no blurry vision, headaches or peripheral edema, and RUQ pain.  She plans on breast feeding. She request POPs for birth control. [facility]  Dating: By 17 wk u/s --->  Estimated Date of Delivery: 06/06/20  Sono:    05/12/19@[redacted]w[redacted]d , CWD, normal anatomy, transverse presentation, 3289g, 87% EFW   Prenatal History/Complications:  History of prior cesarean section (NRFHT) History of infant with multiple anomalies  cHTN w/ SIPE A1GDM Language barrier (Spanish)  Past Medical History: Past Medical History:  Diagnosis Date  . Fibroid   . Gestational diabetes   . Hypertension   . Preterm labor     Past Surgical History: Past Surgical History:  Procedure Laterality Date  . CESAREAN SECTION  2017    Obstetrical History: OB History    Gravida  2   Para  1   Term      Preterm  1   AB      Living  1     SAB      IAB      Ectopic      Multiple      Live Births  1           Social History Social History   Socioeconomic History  . Marital status: Single    Spouse name: Not on file  . Number of children: Not on file  . Years of education: Not on file  . Highest education level: Not on file  Occupational History  . Not on file  Tobacco Use  . Smoking status: Never Smoker  . Smokeless tobacco: Never Used  Vaping Use  . Vaping Use: Never used  Substance and Sexual Activity  . Alcohol use: No  . Drug use: No  . Sexual activity: Yes    Birth control/protection: None  Other Topics Concern  . Not on file  Social History Narrative  . Not on file   Social Determinants of Health   Financial Resource Strain: Not on file  Food Insecurity: No Food Insecurity  . Worried About RCharity fundraiserin the Last Year: Never true  . Ran Out of Food in the  Last Year: Never true  Transportation Needs: No Transportation Needs  . Lack of Transportation (Medical): No  . Lack of Transportation (Non-Medical): No  Physical Activity: Not on file  Stress: Not on file  Social Connections: Not on file    Family History: Family History  Problem Relation Age of Onset  . Vision loss Father     Allergies: No Known Allergies  Medications Prior to Admission  Medication Sig Dispense Refill Last Dose  . NIFEdipine (PROCARDIA-XL/NIFEDICAL-XL) 30 MG 24 hr tablet Take 1 tablet (30 mg total) by mouth daily. 30 tablet 4 05/11/2020 at Unknown time  . Accu-Chek Softclix Lancets lancets 1 each by Other route 4 (four) times daily. 100 each 12   . aspirin EC 81 MG tablet Take 1 tablet (81 mg total) by mouth daily. 30 tablet 10   . Blood Glucose Monitoring Suppl (ACCU-CHEK GUIDE) w/Device KIT 1 Device by Does not apply route 4 (four) times daily. 1 kit 0   . Cholecalciferol 250 MCG (10000 UT) TABS Take 1 tablet by mouth daily. 60 tablet 1   . folic acid (FOLVITE) 4094  MCG tablet Take 400 mcg by mouth daily.     Marland Kitchen glucose blood (ACCU-CHEK GUIDE) test strip Use to check blood sugars four times a day was instructed 50 each 12   . Prenatal Vit-Fe Fumarate-FA (PRENATAL MULTIVITAMIN) TABS tablet Take 1 tablet by mouth daily at 12 noon.        Review of Systems   All systems reviewed and negative except as stated in HPI  Blood pressure (!) 156/95, pulse 91, temperature 98.9 F (37.2 C), temperature source Oral, resp. rate 18, last menstrual period 09/18/2019, SpO2 99 %, unknown if currently breastfeeding. General appearance: alert, cooperative and no distress Lungs: normal respiratory effort Heart: regular rate and rhythm Abdomen: soft, non-tender; gravid Pelvic: as noted below Extremities: Homans sign is negative, no sign of DVT Presentation: breech by MAU provider exam Fetal monitoringBaseline: 140 bpm, Variability: Good {> 6 bpm), Accelerations: Reactive and  Decelerations: Absent Uterine activityFrequency: Every 2-3 minutes Dilation: 3 Exam by:: Carmelia Roller, CNM   Prenatal labs: ABO, Rh: O/Positive/-- (08/24 1408) Antibody: Negative (08/24 1408) Rubella: 2.07 (08/24 1408) RPR: Non Reactive (08/24 1408)  HBsAg: Negative (08/24 1408)  HIV: Non Reactive (08/24 1408)  GBS:   unknown given GA 1 hr Glucola passed, failed 2hr gtt Genetic screening  normal Anatomy US EIF, low lying placenta  Prenatal Transfer Tool  Maternal Diabetes: Yes:  Diabetes Type:  Diet controlled Genetic Screening: Normal Maternal Ultrasounds/Referrals: Isolated EIF (echogenic intracardiac focus) Fetal Ultrasounds or other Referrals:  Referred to Materal Fetal Medicine  Maternal Substance Abuse:  No Significant Maternal Medications:  Meds include: Other: procardia Significant Maternal Lab Results: None  No results found for this or any previous visit (from the past 24 hour(s)).  Patient Active Problem List   Diagnosis Date Noted  . Normal labor 05/12/2020  . Transverse fetal lie 05/09/2020  . Hypokalemia 04/18/2020  . History of postpartum hemorrhage, currently pregnant 03/08/2020  . GDM (gestational diabetes mellitus), class A1 01/22/2020  . Chronic hypertension affecting pregnancy 01/20/2020  . Echogenic intracardiac focus of fetus on prenatal ultrasound 01/13/2020  . Low vitamin D level 12/29/2019  . Language barrier 12/28/2019  . History of severe pre-eclampsia 12/28/2019  . History of preterm delivery 12/28/2019  . History of fetal anomaly in prior pregnancy, currently pregnant 12/28/2019  . AMA (advanced maternal age) multigravida 27+, third trimester 12/28/2019  . H/O cesarean section complicating pregnancy 19/41/7408  . Uterine fibroid in pregnancy 04/21/2015  . 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 Supervision of high risk pregnancy, antepartum 04/13/2015     Assessment/Plan:  Cassandra Stokes is a 40 y.o. G2P0101 at 85w3dhere for SOL, found to be in breech presentation with history of cesarean section for NRFHT and no history of vaginal delivery.  #rLTCS  The risks of cesarean section were discussed with the patient including but were not limited to: bleeding which may require transfusion or reoperation; infection which may require antibiotics; injury to bowel, bladder, ureters or other surrounding organs; injury to the fetus; need for additional procedures including hysterectomy in the event of a life-threatening hemorrhage; placental abnormalities wth subsequent pregnancies, incisional problems, thromboembolic phenomenon and other postoperative/anesthesia complications. She will remain NPO for procedure. Anesthesia and OR aware.  Preoperative prophylactic antibiotics and SCDs ordered on call to the OR.  To OR when ready.  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 Compliant with procardia. Asymptomatic. 1 severe range BP since presentation to MAU. PreE labs pending.  #A1GDM: EFW 87%ile and 3289g yesterday. Not on meds.   #Language barrier: interpreter used for entirety of visit.  Arrie Senate, MD  05/12/2020, 4:42 AM

## 2020-05-12 NOTE — Discharge Instructions (Signed)

## 2020-05-12 NOTE — Lactation Note (Signed)
This note was copied from a baby's chart. Lactation Consultation Note  Patient Name: Cassandra Stokes [id number]'[id number] Date: 05/12/2020 Reason for consult: Initial assessment;Early term 37-38.6wks Age:39 hours  Consult was done in Spanish:  Initial visit to 55 hours old, LPTI of a P2 mother. Mother has the intention to breastfeed this infant. Mother only breastfed her first child for a short period supplementing with formula.   Talked to mother about hand expression and demonstrated technique. Able to see a few drops of colostrum. Noted bruise upper right breast. Mother explained it happened a few days ago and it is improving.   Infant showed hunger cues and mother showed interest to latch. Attempted latch several times. Infant is too sleepy and uninterested. Assisted with swaddling and placed infant back to basinet, per mother's request. Discussed skin to skin benefits. Reinforced pace bottlefeeding, upright position and frequent burping. Showed parents how to use bulb syringe to clear infant's mouth.    Set up DEBP and assisted mother with pumping.   Reviewed LPTI policy with mother and encouraged to contact [facility] for support and questions.    Maternal Data Formula Feeding for Exclusion: No Has patient been taught Hand Expression?: Yes Does the patient have breastfeeding experience prior to this delivery?: Yes  Feeding Feeding Type: Breast Fed Nipple Type: Extra Slow Flow  LATCH Score Latch: Too sleepy or reluctant, no latch achieved, no sucking elicited.  Audible Swallowing: None  Type of Nipple: Everted at rest and after stimulation  Comfort (Breast/Nipple): Soft / non-tender  Hold (Positioning): Assistance needed to correctly position infant at breast and maintain latch.  LATCH Score: 5  Interventions Interventions: Breast feeding basics reviewed;Assisted with latch;Skin to skin;Breast massage;Hand express;Adjust position;Support pillows;Expressed milk;DEBP  Lactation  Tools Discussed/Used WIC Program: Yes Pump Education: Setup, frequency, and cleaning Initiated by:: Cassandra Stokes Date initiated:: 05/12/20   Consult Status Consult Status: Follow-up Date: 05/13/20 Follow-up type: In-patient    Cassandra Stokes 05/12/2020, 2:36 PM

## 2020-05-12 NOTE — Anesthesia Procedure Notes (Signed)
Spinal  Patient location during procedure: OR Start time: 05/12/2020 6:06 AM End time: 05/12/2020 6:09 AM Staffing Performed: anesthesiologist  Anesthesiologist: Brennan Bailey, MD Preanesthetic Checklist Completed: patient identified, IV checked, risks and benefits discussed, monitors and equipment checked, pre-op evaluation and timeout performed Spinal Block Patient position: sitting Prep: DuraPrep and site prepped and draped Patient monitoring: heart rate, continuous pulse ox and blood pressure Approach: midline Location: L3-4 Injection technique: single-shot Needle Needle type: Pencan  Needle gauge: 24 G Needle length: 10 cm Assessment Sensory level: T4 Additional Notes Risks, benefits, and alternative discussed. Patient gave consent to procedure. Prepped and draped in sitting position. Clear CSF obtained after one needle pass. Positive terminal aspiration. No pain or paraesthesias with injection. Patient tolerated procedure well. Vital signs stable. Tawny Asal, MD

## 2020-05-12 NOTE — Discharge Summary (Addendum)
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, antepartum   Language barrier   History of severe pre-eclampsia   History of preterm delivery   History of fetal anomaly in prior pregnancy, currently pregnant   AMA (advanced maternal age) multigravida 35+, third trimester   H/O cesarean section complicating pregnancy   Echogenic intracardiac focus of fetus on prenatal ultrasound   Chronic hypertension affecting pregnancy   GDM (gestational diabetes mellitus), class A1   History of postpartum hemorrhage, currently pregnant   Normal labor   Cesarean delivery delivered  Additional problems: none    Discharge diagnosis: Preterm Pregnancy Delivered and CHTN with superimposed preeclampsia                                              Post partum procedures:none Augmentation: N/A Complications: None  Hospital course: Onset of Labor With Unplanned C/S   40y.o. yo G2P0202 at 358w3das admitted in [place] 05/12/2020. The patient went for cesarean section due to Malpresentation. Delivery details as follows: Membrane Rupture Time/Date: 2:00 AM ,05/12/2020   Delivery Method:C-Section, Low Transverse  Details of operation can be found in separate operative note. Patient had an uncomplicated postpartum course.  She is ambulating,tolerating a regular diet, passing flatus, and urinating well.  On POD #2 she c/o occipital HA, worse with upright positions, and improved with Tylenol.  Anesthesia was consulted. She was asymtomatic with blood loss anemia and received Venofer. Patient is discharged home in stable condition 05/15/20.  Newborn Data: Birth date:05/12/2020  Birth time:6:37 AM  Gender:Female  Living status:Living  Apgars:8 ,9  Weight:2920 g   Magnesium Sulfate received: No BMZ received: No Rhophylac:N/A MMR:N/A T-DaP: declined Flu: No Transfusion:No  Physical exam  Vitals:   05/15/20 0521 05/15/20 0913 05/15/20 1033 05/15/20 1225  BP: 139/83 (!) 143/87 (!) 149/98 (!) 148/95  Pulse: 74 95 82 86  Resp: 18     Temp: 98.7 F (37.1 C)     TempSrc: Oral     SpO2: 99%     Weight:      Height:       General: alert, cooperative and no distress Lochia: appropriate Uterine Fundus: firm Incision: Healing well with no significant drainage, No significant erythema, Dressing is clean, dry, and intact DVT Evaluation: No evidence of DVT seen on physical exam. Negative Homan's sign. No cords or calf tenderness. No significant calf/ankle edema. Labs: Lab Results  Component Value Date   WBC 6.8 05/15/2020   HGB 9.4 (L) 05/15/2020   HCT 27.6 (L) 05/15/2020   MCV 89.3 05/15/2020   PLT 326 05/15/2020   CMP Latest Ref Rng & Units 05/15/2020  Glucose 70 - 99 mg/dL 103(H)  BUN 6 - 20 mg/dL 7  Creatinine 0.44 - 1.00 mg/dL 0.50  Sodium 135 - 145 mmol/L 138  Potassium 3.5 - 5.1 mmol/L 3.6  Chloride 98 - 111 mmol/L 106  CO2 22 - 32 mmol/L 20(L)  Calcium 8.9 - 10.3 mg/dL 9.0  Total Protein 6.5 - 8.1 g/dL 6.4(L)  Total Bilirubin 0.3 - 1.2 mg/dL 0.4  Alkaline Phos 38 - 126 U/L 107  AST 15 - 41 U/L 24  ALT 0 - 44 U/L 17   Edinburgh Score: Edinburgh Postnatal Depression Scale Screening Tool 05/13/2020  I have been able to laugh and see the funny side of things. 1  I have looked forward with enjoyment to things. 3  I have blamed myself unnecessarily when things went wrong. 2  I have been anxious or worried for no good reason. 2  I have felt scared or panicky for  no good reason. 2  Things have been getting on top of me. 2  I have been so unhappy that I have had difficulty sleeping. 3  I have felt sad or miserable. 3  I have been so unhappy that I have been crying. 1  The thought of harming myself has occurred to me. 2  Edinburgh Postnatal Depression Scale Total 21     After visit meds:  Allergies as of 05/15/2020   No Known Allergies     Medication List    STOP taking these medications   Accu-Chek Guide test strip Generic drug: glucose blood   Accu-Chek Softclix Lancets lancets   aspirin EC 81 MG tablet   folic acid 161 MCG tablet Commonly known as: FOLVITE     TAKE these medications   Accu-Chek Guide w/Device Kit 1 Device by Does not apply route 4 (four) times daily.   Cholecalciferol 250 MCG (10000 UT) Tabs Take 1 tablet by mouth daily.   ibuprofen 600 MG tablet Commonly known as: ADVIL Take 1 tablet (600 mg total) by mouth every 6 (six) hours.   iron polysaccharides 150 MG capsule Commonly known as: NIFEREX Take 1 capsule (150 mg total) by mouth every other day.   NIFEdipine 30 MG 24 hr tablet Commonly known as: PROCARDIA-XL/NIFEDICAL-XL Take 1 tablet (30 mg total) by mouth daily. What changed: Another medication with the same name was added. Make sure you understand how and when to take each.   NIFEdipine 30 MG 24 hr tablet Commonly known as: PROCARDIA-XL/NIFEDICAL-XL Take 1 tablet (30 mg total) by mouth daily. What changed: You were already taking a medication with the same name, and this prescription was added. Make sure you understand how and when to take each.   oxyCODONE 5 MG immediate release tablet Commonly known as: Oxy IR/ROXICODONE Take 1 tablet (5 mg total) by mouth every 4 (four) hours as needed for moderate pain.   prenatal multivitamin Tabs tablet Take 1 tablet by mouth daily at 12 noon.        Discharge home in stable condition Infant Feeding: Breast Infant Disposition:home with  mother Discharge instruction: per After Visit Summary and Postpartum booklet. Activity: Advance as tolerated. Pelvic rest for 6 weeks.  Diet: routine diet Future Appointments: Future Appointments  Date Time Provider [place]  05/19/2020 10:00 AM [facility] NURSE [facility]  06/08/2020 11:00 AM Aletha Halim, MD [facility]   Follow up Visit:  [place] [facility]. Schedule an appointment as soon as possible for a visit in 1 week(s).   Specialty: Obstetrics and Gynecology Contact information: [place] [phone]             Message sent to [facility] by Sylvester Harder  05/13/19.   Please schedule this patient for a In person postpartum visit in 4 weeks with the following provider: Any provider. Additional Postpartum F/U:Incision check 1 week and BP check 1 week  High risk pregnancy complicated by: HTN Delivery mode:  C-Section, Low Transverse  Anticipated Birth Control:  POPs   05/15/2020 Shary Key, DO

## 2020-05-12 NOTE — MAU Note (Signed)
Pt transported to OR via stretcher in stable condition.  Handoff given to OR charge nurse.

## 2020-05-12 NOTE — MAU Note (Signed)
Pt presents to MAU for leaking of clear watery fluid beginning around 2 am and contractions that are more painful and closer together per pt.  Pt reports small amt of bloody show.  Pt endorses +FM.

## 2020-05-12 NOTE — Anesthesia Postprocedure Evaluation (Signed)
Anesthesia Post Note  Patient: Cassandra Stokes  Procedure(s) Performed: [place]     Patient location during evaluation: PACU Anesthesia Type: Spinal Level of consciousness: oriented and awake and alert Pain management: pain level controlled Vital Signs Assessment: post-procedure vital signs reviewed and stable Respiratory status: spontaneous breathing, respiratory function stable and patient connected to nasal cannula oxygen Cardiovascular status: blood pressure returned to baseline and stable Postop Assessment: no headache, no backache and no apparent nausea or vomiting Anesthetic complications: no   No complications documented.  Last Vitals:  Vitals:   05/12/20 1058 05/12/20 1204  BP: (!) 142/83 133/80  Pulse: 84 96  Resp: 16 17  Temp: 36.7 C 37.2 C  SpO2: 99% 98%    Last Pain:  Vitals:   05/12/20 1204  TempSrc: Oral  PainSc: 0-No pain   Pain Goal: Patients Stated Pain Goal: 3 (05/12/20 0825)                 Ananda Caya L Arlethia Basso

## 2020-05-12 NOTE — Op Note (Signed)
Preoperative Diagnosis:  IUP @ [redacted]w[redacted]d, breech presentation, preterm labor, prior cesarean section  Postoperative Diagnosis:  Same  Procedure: repeat low transverse cesarean section  Surgeon: Darron Doom, M.D.  Assistant: Corliss Blacker, MD  Findings: Viable female infant, APGAR (1 MIN): 8   APGAR (5 MINS): 9   Weight pending, breech presentation  Estimated blood loss: 998 cc  Complications: None known  Specimens: Placenta to labor and delivery  Reason for procedure: Briefly, the patient is a 40 y.o. P3A2505 [redacted]w[redacted]d who presents for preterm labor with fetus in breech presentation with history of prior cesarean section. Given no history of vaginal delivery, decision made for repeat low cesarean section.  Procedure: Patient is a to the OR where spinal analgesia was administered. She was then placed in a supine position with left lateral tilt. She received 2 g of Ancef and SCDs were in place. A timeout was performed. She was prepped and draped in the usual sterile fashion. A Foley catheter was placed in the bladder. A knife was then used to make a Pfannenstiel incision over prior scar. This incision was carried out to underlying fascia which was divided in the midline with the knife. The incision was extended laterally, sharply.  The rectus was divided in the midline.  The peritoneal cavity was entered bluntly.  Alexis retractor was placed inside the incision.  A knife was used to make a low transverse incision on the uterus. This incision was carried down to the amniotic cavity was entered. Fetus was in breech position and was brought up out of the incision without difficulty. Cord was clamped x 2 and cut immediately. Infant taken to waiting nurse.  Blood gas drawn. Cord blood was obtained. Placenta was delivered from the uterus.  Uterus was cleaned with dry lap pads. Uterine incision closed with 0 Monocryl suture in a locked running fashion. A second layer of 0 Monocryl in an imbricating fashion  was used to achieve hemostasis. Alexis retractor was removed from the abdomen. Peritoneal closure was done with 0 Monocryl suture.  Fascia is closed with 0 Vicryl suture in a running fashion. Subcutaneous tissue infused with 30cc 0.25% Marcaine.  Subcutaneous closure was performed with 0 plain suture.  Skin closed using 3-0 Vicryl on a Keith needle.  Steri strips applied, followed by pressure dressing.  All instrument, needle and lap counts were correct x 2.  Patient was awake and taken to PACU stable.  Infant remained with mom in couplet care, stable.   Gifford Shave FirestoneMD 07/13/7671 8:27 AM

## 2020-05-13 DIAGNOSIS — O115 Pre-existing hypertension with pre-eclampsia, complicating the puerperium: Secondary | ICD-10-CM

## 2020-05-13 DIAGNOSIS — O9903 Anemia complicating the puerperium: Secondary | ICD-10-CM

## 2020-05-13 DIAGNOSIS — O1093 Unspecified pre-existing hypertension complicating the puerperium: Secondary | ICD-10-CM

## 2020-05-13 DIAGNOSIS — O24430 Gestational diabetes mellitus in the puerperium, diet controlled: Secondary | ICD-10-CM

## 2020-05-13 DIAGNOSIS — D62 Acute posthemorrhagic anemia: Secondary | ICD-10-CM

## 2020-05-13 LAB — CBC
HCT: 22.1 % — ABNORMAL LOW (ref 36.0–46.0)
Hemoglobin: 7.6 g/dL — ABNORMAL LOW (ref 12.0–15.0)
MCH: 30.2 pg (ref 26.0–34.0)
MCHC: 34.4 g/dL (ref 30.0–36.0)
MCV: 87.7 fL (ref 80.0–100.0)
Platelets: 220 10*3/uL (ref 150–400)
RBC: 2.52 MIL/uL — ABNORMAL LOW (ref 3.87–5.11)
RDW: 12.9 % (ref 11.5–15.5)
WBC: 10.4 10*3/uL (ref 4.0–10.5)
nRBC: 0 % (ref 0.0–0.2)

## 2020-05-13 MED ORDER — SODIUM CHLORIDE 0.9 % IV SOLN
500.0000 mg | Freq: Once | INTRAVENOUS | Status: AC
  Administered 2020-05-13: 500 mg via INTRAVENOUS
  Filled 2020-05-13: qty 25

## 2020-05-13 NOTE — Lactation Note (Addendum)
This note was copied from a baby's chart. Lactation Consultation Note  Patient Name: Cassandra Stokes RJJOA'C Date: 05/13/2020 Reason for consult: Follow-up assessment;Late-preterm 34-36.6wks Age:39 hours  Consult was done in Spanish:   Follow up with 30 hours old infant with 0.89% weight loss at the time of visit. Mother states she has not pumped because she does not have any milk. Mother reports getting some drops with hand expression. Mother explains she has fed with formula only because she is not feeling well. Reinforced the importance of pumping every time she formula-feeds infant. Discussed pacing while bottle-feeding formula. Infant started showing hunger cues during visit. Mother stated infant had ~62mL of formula last feeding.  LC unswaddled infant to check diaper. Infant had a stool and LC changed diaper. Infant continued cueing. Mother declined assistance with latch at this point but requests assistance to bottlefeed. Infant took 12 mL of formula. LC demonstrated pacing, upright position and frequent burping. Infant seems content. Encouraged mother pump to ensure milk supply.  All questions answered at this time.    Maternal Data Formula Feeding for Exclusion: No Has patient been taught Hand Expression?: Yes  Feeding Feeding Type: Bottle Fed - Formula Nipple Type: Extra Slow Flow  Interventions Interventions: DEBP;Hand express;Breast massage;Skin to skin;Expressed milk;Breast feeding basics reviewed  Lactation Tools Discussed/Used Tools: Pump;Bottle Breast pump type: Double-Electric Breast Pump WIC Program: Yes   Consult Status Consult Status: Follow-up Date: 05/14/20 Follow-up type: In-patient    Cassandra Stokes A Higuera Ancidey 05/13/2020, 3:58 PM

## 2020-05-13 NOTE — Progress Notes (Signed)
Subjective: POD#1 rLTCS breech presentation  Patient is doing well without complaints. Ambulating without difficulty. Voiding and passing flatus. Tolerating PO. Abdominal pain improved. Vaginal bleeding decreased.  Objective: Vital signs in last 24 hours: Temp:  [97.9 F (36.6 C)-98.9 F (37.2 C)] 98 F (36.7 C) (01/08 0430) Pulse Rate:  [76-101] 90 (01/08 0625) Resp:  [16-20] 16 (01/08 0430) BP: (113-142)/(75-88) 123/76 (01/08 0625) SpO2:  [98 %-100 %] 100 % (01/08 0430)  Physical Exam:  General: alert, cooperative and no distress Lochia: appropriate Uterine Fundus: firm Incision: dressing c/d/i DVT Evaluation: No evidence of DVT seen on physical exam.  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  Arrie Senate 10/07/1495, 8:23 AM

## 2020-05-14 LAB — RPR: RPR Ser Ql: NONREACTIVE

## 2020-05-14 MED ORDER — POLYSACCHARIDE IRON COMPLEX 150 MG PO CAPS
150.0000 mg | ORAL_CAPSULE | Freq: Every day | ORAL | Status: DC
  Administered 2020-05-14 – 2020-05-15 (×2): 150 mg via ORAL
  Filled 2020-05-14 (×2): qty 1

## 2020-05-14 MED ORDER — IBUPROFEN 600 MG PO TABS: 600.0000 mg | ORAL_TABLET | Freq: Four times a day (QID) | ORAL | 0 refills | Status: DC

## 2020-05-14 MED ORDER — OXYCODONE HCL 5 MG PO TABS: 5.0000 mg | ORAL_TABLET | ORAL | 0 refills | Status: DC | PRN

## 2020-05-14 MED ORDER — POLYSACCHARIDE IRON COMPLEX 150 MG PO CAPS: 150.0000 mg | ORAL_CAPSULE | ORAL | 1 refills | Status: DC

## 2020-05-14 NOTE — Anesthesia Pain Management Evaluation Note (Addendum)
  Anesthesia Pain Consult Note  Patient: Fusaye Wachtel, 40 y.o., female  Consult Requested by: Donnamae Jude, MD  Reason for Consult:Headache  The patent had spinal anesthesia for Cesarean Section on 05/12/20. The block was placed uneventfully, single pass with a 24 gauge Pencan needle and clear CSF return.  It is doubtful that headache symptoms are related to the spinal.   Level of Consciousness: Awake, alert and oriented in no apparent distress. No symptoms of nausea or vomiting.The patient is eating and drinking fluids, and ambulating.   Pain:  4/10    Plan: The patient states that her headache is much better. She has been up to the bathroom without exacerbation of her symptoms. Her pain is controlled by Tylenol. She stated that she doesn't need a stronger medication to relieve her headache and thinks that it is going away.    Past Medical History:  Diagnosis Date  . Fibroid   . Gestational diabetes   . Hypertension   . Preterm labor    Past Surgical History:  Procedure Laterality Date  . CESAREAN SECTION  2017  . CESAREAN SECTION  05/12/2020   Procedure: CESAREAN SECTION;  Surgeon: Donnamae Jude, MD;  Location: [facility] LD ORS;  Service: Obstetrics;;    Katherina Mires 05/14/2020

## 2020-05-14 NOTE — Lactation Note (Signed)
This note was copied from a baby's chart. Lactation Consultation Note  Patient Name: Cassandra Stokes [id number] Date: 05/14/2020 Reason for consult: Follow-up assessment;Late-preterm 34-36.6wks Age:39 hours  Visited with mom of 7 hours old LPI female, she's been mostly formula feeding, she hasn't been pumping either she has a DEBP set up in her room; baby has gained 1% of birth weight. Mom had GDM during the pregnancy but baby's serum glucose post-birth were at 53 and 66 respectively.   Baby is now on Similac 20 calorie formula and using an extra slow/slow flow nipple. Offered assistance with latch but mom declined she told LC that she had to go to the bathroom. Asked mom to call for assistance when needed.  Explained to mom that importance of consistent pumping but she said that she hasn't been feeling well, she had a C/S and that's why she hasn't pump or breastfeed, the last time she put baby to breast was during the night; last LATCH score recorded was 8.   Reviewed normal newborn behavior, feeding cues and LPI policy. She asked for more formula, RN Abby came in the room and delivered a 4 pack of Similac 20 calorie formula with slow flow nipples.  Feeding plan:  1. Encouraged mom to try to put baby to breast for feedings, ideally 8-12 times/24 hours; explained to mom that feedings at the breast are usually more frequently than what she gives baby a bottle 2. Pumping every 3 hours was also encouraged 3. Mom will continue supplementing baby with Similac 20 calorie formula per feeding choice  Dad present at the time of [facility] consultation, but had to leave to check on couple's other children. Parents reported all questions and concerns were answered, they're both aware of [facility] OP services and will call PRN.    Maternal Data    Feeding Feeding Type: Formula  LATCH Score                   Interventions Interventions: Breast feeding basics reviewed;DEBP  Lactation Tools  Discussed/Used Tools: Pump Breast pump type: Double-Electric Breast Pump   Consult Status Consult Status: Follow-up Date: 05/15/20 Follow-up type: In-patient    Rhodie Cienfuegos Francene Boyers 05/14/2020, 2:49 PM

## 2020-05-14 NOTE — Progress Notes (Addendum)
Pt states that her HA is better but it gets worse when standing or sitting up.  Anest MD and resident MD notified.

## 2020-05-14 NOTE — Lactation Note (Signed)
This note was copied from a baby's chart. Lactation Consultation Note  Patient Name: Cassandra Stokes [id number] Date: 05/14/2020 Reason for consult: Follow-up assessment;Late-preterm 34-36.6wks Age:39 hours  RN Suzie called LC for assistance because baby was having difficulty latching on. By the time LC got in the room, baby was already nursing in typical cradle position at the left breast. Latched seemed deep and mom denied any pain or discomfort. Asked mom if she needed assistance with the other breast when switching sides, she agreed, baby was falling asleep.  Showed mom how to breast the latch and position baby, this time STS (removed dirty clothes, and change bedding & blankets) and he easily latched on to the left breast, it took a couple of attempts though because the first time he wouldn't open his mouth wide enough. Baby still nursing when exiting the room at the 20 minutes mark (counting both breasts).  Feeding plan:  1. Encouraged mom to try to put baby to breast for feedings, ideally 8-12 times/24 hours; explained to mom that feedings at the breast are usually more frequently than what she gives baby a bottle 2. Pumping every 3 hours was also encouraged 3. Mom will continue supplementing baby with Similac 20 calorie formula per feeding choice  No support person in mom's room at the time of [facility] consultation. Mom reported all questions and concerns were answered, she's aware of [facility] OP services and will call PRN.   Maternal Data    Feeding Feeding Type: Breast Fed  LATCH Score                   Interventions Interventions: Breast feeding basics reviewed;DEBP  Lactation Tools Discussed/Used Tools: Pump Breast pump type: Double-Electric Breast Pump   Consult Status Consult Status: Follow-up Date: 05/15/20 Follow-up type: In-patient    Cassandra Stokes 05/14/2020, 5:22 PM

## 2020-05-14 NOTE — Progress Notes (Signed)
Subjective: Postpartum Day 2: Cesarean Delivery Patient reports incisional pain, tolerating PO, + flatus and no problems voiding, significant head ache.  Objective: Vital signs in last 24 hours: Temp:  [98.2 F (36.8 C)-98.8 F (37.1 C)] 98.8 F (37.1 C) (01/09 0603) Pulse Rate:  [74-84] 84 (01/08 2055) Resp:  [16-18] 16 (01/09 0603) BP: (115-131)/(69-81) 123/79 (01/09 0603) SpO2:  [99 %] 99 % (01/09 0603)  Physical Exam:  General: alert, cooperative, appears stated age and no distress Lochia: appropriate Uterine Fundus: firm Incision: healing well, no significant drainage, no significant erythema DVT Evaluation: No evidence of DVT seen on physical exam.  Recent Labs    05/12/20 0456 05/13/20 0521  HGB 12.1 7.6*  HCT 36.9 22.1*    Assessment/Plan: Status post Cesarean section. Doing well postoperatively.  Continue current care, patient has SIPE with procardia, now with HA.  #POD#2 rLTCS breech  -doing well overall, meeting pp milestones  -VSS  -interested in POPs  - does not desire circ  #Acute blood loss post op anemia, PPH  - hgb 12.1>7.6>venofer  #cHTN w/ SIPE w/o SF  -continue home procardia 30 mg daily  -continue to monitor  -BP stable and at goal  -HA today, will treat with tylenol   #GDM  -FBGL stable  If patient headache improved and BP stable, she could potentially go home today. Otherwise tomorrow.  Gladys Damme 05/14/2020, 8:45 AM

## 2020-05-14 NOTE — Progress Notes (Signed)
CSW received consult due to score 21 on Edinburgh Depression Screen.    CSW went to speak with MOB at bedside using spanish interpretor Jesus [phone]. CSW congratulated MOB and FOB on the birth of infant and advised MOB of HIPPA policy in which MOB was agreeable to having FOB leave room. CSW then advised MOB of CSW's role and the reason for CSW coming to speak with her. MOB expressed that she took the [place] last night and that she was very tired. MOB expressed not being aware of her score however reported that over the last 7 days she has been feeling a little bit better. MOB expressed concerns around her blood pressure as well. MOB expressed that aside from the concern of her blood pressure, MOB has been feeling well. CSW inquired from MOB on mental health hx in which MOB reports that she has none. MOB expressed that since giving birth she has not felt SI, HI nor is MOB involved in DV.   CSW inquired from [facility] on feelings of SI as CSW notified MOB that she circled 2 to question 10. MOB again expressed feelings of tiredness last night and not being aware of circling 2 to 10. CSW was advised that per MOB's report she is fine and has no concerns. MOB indicated that she has all needed items to care for infant with plans for infant to sleep in basinet once arrived home.   CSW provided education regarding Baby Blues vs PMADs and provided MOB with resources for mental health follow up.  CSW encouraged MOB to evaluate her mental health throughout the postpartum period with the use of the New Mom Checklist developed by [facility] as well as the [place] Postnatal Depression Scale and notify a medical professional if symptoms arise.     Virgie Dad Imogen Maddalena, MSW, LCSW [facility] [phone]

## 2020-05-14 NOTE — Addendum Note (Signed)
Addendum  created 05/14/20 1150 by Flossie Dibble, CRNA   Charge Capture section accepted, Clinical Note Signed

## 2020-05-15 DIAGNOSIS — D649 Anemia, unspecified: Secondary | ICD-10-CM

## 2020-05-15 DIAGNOSIS — O99893 Other specified diseases and conditions complicating puerperium: Secondary | ICD-10-CM

## 2020-05-15 DIAGNOSIS — Z8759 Personal history of other complications of pregnancy, childbirth and the puerperium: Secondary | ICD-10-CM

## 2020-05-15 DIAGNOSIS — O24430 Gestational diabetes mellitus in the puerperium, diet controlled: Secondary | ICD-10-CM

## 2020-05-15 DIAGNOSIS — O9903 Anemia complicating the puerperium: Secondary | ICD-10-CM

## 2020-05-15 DIAGNOSIS — D259 Leiomyoma of uterus, unspecified: Secondary | ICD-10-CM

## 2020-05-15 DIAGNOSIS — O1093 Unspecified pre-existing hypertension complicating the puerperium: Secondary | ICD-10-CM

## 2020-05-15 DIAGNOSIS — R87612 Low grade squamous intraepithelial lesion on cytologic smear of cervix (LGSIL): Secondary | ICD-10-CM

## 2020-05-15 DIAGNOSIS — Z98891 History of uterine scar from previous surgery: Secondary | ICD-10-CM

## 2020-05-15 LAB — COMPREHENSIVE METABOLIC PANEL
ALT: 17 U/L (ref 0–44)
AST: 24 U/L (ref 15–41)
Albumin: 2.8 g/dL — ABNORMAL LOW (ref 3.5–5.0)
Alkaline Phosphatase: 107 U/L (ref 38–126)
Anion gap: 12 (ref 5–15)
BUN: 7 mg/dL (ref 6–20)
CO2: 20 mmol/L — ABNORMAL LOW (ref 22–32)
Calcium: 9 mg/dL (ref 8.9–10.3)
Chloride: 106 mmol/L (ref 98–111)
Creatinine, Ser: 0.5 mg/dL (ref 0.44–1.00)
GFR, Estimated: >60 mL/min (ref >60)
Glucose, Bld: 103 mg/dL — ABNORMAL HIGH (ref 70–99)
Potassium: 3.6 mmol/L (ref 3.5–5.1)
Sodium: 138 mmol/L (ref 135–145)
Total Bilirubin: 0.4 mg/dL (ref 0.3–1.2)
Total Protein: 6.4 g/dL — ABNORMAL LOW (ref 6.5–8.1)

## 2020-05-15 LAB — CBC
HCT: 27.6 % — ABNORMAL LOW (ref 36.0–46.0)
Hemoglobin: 9.4 g/dL — ABNORMAL LOW (ref 12.0–15.0)
MCH: 30.4 pg (ref 26.0–34.0)
MCHC: 34.1 g/dL (ref 30.0–36.0)
MCV: 89.3 fL (ref 80.0–100.0)
Platelets: 326 10*3/uL (ref 150–400)
RBC: 3.09 MIL/uL — ABNORMAL LOW (ref 3.87–5.11)
RDW: 13.4 % (ref 11.5–15.5)
WBC: 6.8 10*3/uL (ref 4.0–10.5)
nRBC: 0 % (ref 0.0–0.2)

## 2020-05-15 MED ORDER — NIFEDIPINE ER OSMOTIC RELEASE 30 MG PO TB24: 30.0000 mg | ORAL_TABLET | Freq: Every day | ORAL | 1 refills | Status: DC

## 2020-05-15 NOTE — Progress Notes (Signed)
Spoke to [facility] associate who stated the four meds that were sent over for pt will be ready in about one hour.  Total (with no insurance) for all meds will be $64.72.  Patient was called at [phone] and notified about this.  She had called [facility] after being d/c home because her [facility] were not ready [facility] and she did not understand why.    Pt states she will be abe to pay the $64.72 and will speak to the doctor about further medication assistance if needed.

## 2020-05-15 NOTE — Lactation Note (Signed)
This note was copied from a baby's chart. Lactation Consultation Note  Patient Name: Cassandra Stokes [id number] Date: 05/15/2020 Reason for consult: Follow-up assessment, LPI Age:39 hours  P2, Elena RN assisted with interpreting.  Mother not feeling well and states therefore she is primarily formula feeding for now.  She does occasionally breastfeed before offering formula.  Discussed supply and demand.  Reviewed engorgement care and monitoring voids/stools. Mother denies questions or concerns.    Feeding Feeding Type: Bottle Fed - Formula Nipple Type: Slow - flow  Interventions Interventions: Breast feeding basics reviewed;DEBP  Lactation Tools Discussed/Used  DEBP   Consult Status Consult Status: Complete Date: 05/15/20    Vivianne Master [facility] 05/15/2020, 9:30 AM

## 2020-05-16 ENCOUNTER — Encounter: Payer: Self-pay | Admitting: Obstetrics & Gynecology

## 2020-05-18 ENCOUNTER — Ambulatory Visit: Payer: Self-pay

## 2020-05-19 ENCOUNTER — Other Ambulatory Visit: Payer: Self-pay

## 2020-05-19 ENCOUNTER — Ambulatory Visit (INDEPENDENT_AMBULATORY_CARE_PROVIDER_SITE_OTHER): Payer: Self-pay | Admitting: *Deleted

## 2020-05-19 VITALS — BP 163/84 | HR 137

## 2020-05-19 DIAGNOSIS — O10919 Unspecified pre-existing hypertension complicating pregnancy, unspecified trimester: Secondary | ICD-10-CM

## 2020-05-19 DIAGNOSIS — Z8759 Personal history of other complications of pregnancy, childbirth and the puerperium: Secondary | ICD-10-CM

## 2020-05-19 LAB — COMPREHENSIVE METABOLIC PANEL
ALT: 30 IU/L (ref 0–32)
AST: 34 IU/L (ref 0–40)
Albumin/Globulin Ratio: 1.4 (ref 1.2–2.2)
Albumin: 4 g/dL (ref 3.8–4.8)
Alkaline Phosphatase: 128 IU/L — ABNORMAL HIGH (ref 44–121)
BUN/Creatinine Ratio: 20 (ref 9–23)
BUN: 11 mg/dL (ref 6–20)
Bilirubin Total: 0.3 mg/dL (ref 0.0–1.2)
CO2: 18 mmol/L — ABNORMAL LOW (ref 20–29)
Calcium: 9.1 mg/dL (ref 8.7–10.2)
Chloride: 107 mmol/L — ABNORMAL HIGH (ref 96–106)
Creatinine, Ser: 0.54 mg/dL — ABNORMAL LOW (ref 0.57–1.00)
GFR calc Af Amer: 138 mL/min/{1.73_m2} (ref >59)
GFR calc non Af Amer: 120 mL/min/{1.73_m2} (ref >59)
Globulin, Total: 2.8 g/dL (ref 1.5–4.5)
Glucose: 130 mg/dL — ABNORMAL HIGH (ref 65–99)
Potassium: 3.3 mmol/L — ABNORMAL LOW (ref 3.5–5.2)
Sodium: 141 mmol/L (ref 134–144)
Total Protein: 6.8 g/dL (ref 6.0–8.5)

## 2020-05-19 LAB — CBC
Hematocrit: 32.7 % — ABNORMAL LOW (ref 34.0–46.6)
Hemoglobin: 10.8 g/dL — ABNORMAL LOW (ref 11.1–15.9)
MCH: 29.3 pg (ref 26.6–33.0)
MCHC: 33 g/dL (ref 31.5–35.7)
MCV: 89 fL (ref 79–97)
Platelets: 442 10*3/uL (ref 150–450)
RBC: 3.68 x10E6/uL — ABNORMAL LOW (ref 3.77–5.28)
RDW: 13.1 % (ref 11.7–15.4)
WBC: 6.3 10*3/uL (ref 3.4–10.8)

## 2020-05-19 MED ORDER — NIFEDIPINE ER OSMOTIC RELEASE 60 MG PO TB24: 60.0000 mg | ORAL_TABLET | Freq: Every day | ORAL | 2 refills | Status: DC

## 2020-05-19 NOTE — Progress Notes (Signed)
Patient was assessed and managed by nursing staff during this encounter. I have reviewed the chart and agree with the documentation and plan. I have also made any necessary editorial changes.  Verita Schneiders, MD 05/19/2020 12:20 PM

## 2020-05-19 NOTE — Progress Notes (Signed)
Subjective:  Cassandra Stokes is a 40 y.o. female here for BP check and incision check.   Hypertension ROS: taking medications as instructed, no medication side effects noted, no TIA's, no chest pain on exertion, no dyspnea on exertion and no swelling of ankles.   Incision: healing well, honeycomb removed, steri strips intact. No redness or drainage noted. Wound instructions given.   Objective:  BP (!) 163/84   Pulse (!) 137   Appearance alert, well appearing, and in no distress. General exam BP noted to need improvement.    Assessment:   Blood Pressure needs improvement. Dr Harolyn Rutherford made aware.   Plan:  Will get CBC, CMET and increase Procardia to 60mg  daily, and follow up on Tuesday to recheck BP.

## 2020-05-23 ENCOUNTER — Encounter: Payer: Self-pay | Admitting: Obstetrics & Gynecology

## 2020-05-25 ENCOUNTER — Other Ambulatory Visit: Payer: Self-pay

## 2020-05-25 ENCOUNTER — Other Ambulatory Visit: Payer: Self-pay | Admitting: Family Medicine

## 2020-05-25 ENCOUNTER — Ambulatory Visit (INDEPENDENT_AMBULATORY_CARE_PROVIDER_SITE_OTHER): Payer: Self-pay | Admitting: *Deleted

## 2020-05-25 VITALS — BP 174/98 | HR 109

## 2020-05-25 DIAGNOSIS — O165 Unspecified maternal hypertension, complicating the puerperium: Secondary | ICD-10-CM

## 2020-05-25 DIAGNOSIS — Z8759 Personal history of other complications of pregnancy, childbirth and the puerperium: Secondary | ICD-10-CM

## 2020-05-25 DIAGNOSIS — O10919 Unspecified pre-existing hypertension complicating pregnancy, unspecified trimester: Secondary | ICD-10-CM

## 2020-05-25 MED ORDER — LISINOPRIL 20 MG PO TABS: 20.0000 mg | ORAL_TABLET | Freq: Every day | ORAL | 0 refills | Status: DC

## 2020-05-25 NOTE — Progress Notes (Signed)
Patient seen and assessed by nursing staff.  Agree with documentation and plan. Added Lisinopril to regimen

## 2020-05-25 NOTE — Progress Notes (Signed)
Subjective:  Cassandra Stokes is a 40 y.o. female here for BP check.   Hypertension ROS: taking medications as instructed, no medication side effects noted, no TIA's, no chest pain on exertion, no dyspnea on exertion and no swelling of ankles.    Objective:  BP (!) 174/98   Pulse (!) 109   Appearance alert, well appearing, and in no distress. General exam BP not well controlled today in office.    Assessment:   Blood Pressure needs improvement.   Plan:  Dr Kennon Rounds made aware of BP in office today, will send in another medication to take along with her Procardia 60mg . Pt to come back in one week to recheck BP. Pt verbalizes and understands.  Marland Kitchen

## 2020-05-30 ENCOUNTER — Encounter: Payer: Self-pay | Admitting: Obstetrics and Gynecology

## 2020-06-01 ENCOUNTER — Ambulatory Visit (INDEPENDENT_AMBULATORY_CARE_PROVIDER_SITE_OTHER): Payer: Self-pay | Admitting: *Deleted

## 2020-06-01 ENCOUNTER — Other Ambulatory Visit: Payer: Self-pay

## 2020-06-01 VITALS — BP 133/80 | HR 111

## 2020-06-01 DIAGNOSIS — O10919 Unspecified pre-existing hypertension complicating pregnancy, unspecified trimester: Secondary | ICD-10-CM

## 2020-06-01 NOTE — Progress Notes (Signed)
Subjective:  Cassandra Stokes is a 40 y.o. female here for BP check.   Hypertension ROS: taking medications as instructed, no medication side effects noted, no TIA's, no chest pain on exertion, no dyspnea on exertion and no swelling of ankles.    Objective:  BP 133/80   Pulse (!) 111   Appearance alert, well appearing, and in no distress. General exam BP noted to be well controlled today in office.  Pt also took [facility], they were 121/76 and 133/83   Assessment:   Blood Pressure stable.   Plan:  Follow up next week for postpartum visit. Marland Kitchen

## 2020-06-08 ENCOUNTER — Ambulatory Visit: Payer: Self-pay | Admitting: Obstetrics and Gynecology

## 2020-06-08 ENCOUNTER — Other Ambulatory Visit: Payer: Self-pay

## 2020-06-08 ENCOUNTER — Ambulatory Visit (INDEPENDENT_AMBULATORY_CARE_PROVIDER_SITE_OTHER): Payer: Self-pay | Admitting: Obstetrics and Gynecology

## 2020-06-08 ENCOUNTER — Encounter: Payer: Self-pay | Admitting: Obstetrics and Gynecology

## 2020-06-08 VITALS — BP 149/85 | HR 70 | Wt 139.0 lb

## 2020-06-08 DIAGNOSIS — O24410 Gestational diabetes mellitus in pregnancy, diet controlled: Secondary | ICD-10-CM

## 2020-06-08 DIAGNOSIS — Z789 Other specified health status: Secondary | ICD-10-CM

## 2020-06-08 DIAGNOSIS — O34219 Maternal care for unspecified type scar from previous cesarean delivery: Secondary | ICD-10-CM

## 2020-06-08 NOTE — Progress Notes (Signed)
    [place] Partum Visit Note  Tiamarie Layana Konkel is a 40 y.o. [phone] female who presents for a postpartum visit. She is 4 weeks postpartum following a primary cesarean section.  I have fully reviewed the prenatal and intrapartum course. The delivery was at [redacted]w[redacted]d gestational weeks.  Anesthesia: spinal. Postpartum course has been uncomplicated. Baby is doing well. Baby is feeding by bottle - Gerber Gentle . Bleeding no bleeding. Bowel function is normal. Bladder function is normal. Patient is not sexually active. Contraception method is none. Postpartum depression screening: negative.   The pregnancy intention screening data noted above was reviewed. Potential methods of contraception were discussed. The patient elected to proceed with Oral Contraceptive.    Edinburgh Postnatal Depression Scale - 06/08/20 0838      Edinburgh Postnatal Depression Scale:  In the Past 7 Days   I have been able to laugh and see the funny side of things. 0    I have looked forward with enjoyment to things. 0    I have blamed myself unnecessarily when things went wrong. 0    I have been anxious or worried for no good reason. 0    I have felt scared or panicky for no good reason. 0    Things have been getting on top of me. 0    I have been so unhappy that I have had difficulty sleeping. 0    I have felt sad or miserable. 0    I have been so unhappy that I have been crying. 0    The thought of harming myself has occurred to me. 0    Edinburgh Postnatal Depression Scale Total 0           Review of Systems Pertinent items are noted in HPI.    Objective:  BP (!) 149/85   Pulse 70   Wt 139 lb (63 kg)   BMI 23.86 kg/m    NAD Normal s1 and s2, no MRGs CTAB Abdomen: soft, nttp, nd. C/d/i incision  Assessment:   Normal  postpartum exam  Plan:   *PP: normal exam. Normal pap last year. Desires OCPs. D/w her that recommend waiting until next visit to start. Likely will have to be POPs *CV: pt  took her lisinopril 20 qday and procardia 60 qday at 0730 today. BP better. Recommended she continue on it and f/u in a month. Hopefully can get her off some but I told her sometimes BP issues from pregnancy can take up to 3 months to resolve; pt was on no meds for her cHTN prior to pregnancy *GDM: GTT today  Interpreter used  RTC: 4 weeks for BP check and follow up visit.   Aletha Halim, MD [facility]

## 2020-06-09 LAB — GLUCOSE TOLERANCE, 2 HOURS
Glucose, 2 hour: 109 mg/dL (ref 65–139)
Glucose, GTT - Fasting: 94 mg/dL (ref 65–99)

## 2020-06-10 ENCOUNTER — Encounter: Payer: Self-pay | Admitting: Obstetrics and Gynecology

## 2020-06-13 NOTE — Progress Notes (Signed)
Patient was assessed and managed by nursing staff during this encounter. I have reviewed the chart and agree with the documentation and plan. I have also made any necessary editorial changes.  Aletha Halim, MD 06/13/2020 10:06 AM

## 2020-06-22 ENCOUNTER — Other Ambulatory Visit: Payer: Self-pay | Admitting: *Deleted

## 2020-06-22 DIAGNOSIS — Z8759 Personal history of other complications of pregnancy, childbirth and the puerperium: Secondary | ICD-10-CM

## 2020-06-22 MED ORDER — LISINOPRIL 20 MG PO TABS: 20.0000 mg | ORAL_TABLET | Freq: Every day | ORAL | 0 refills | Status: DC

## 2020-07-06 ENCOUNTER — Encounter: Payer: Self-pay | Admitting: Family Medicine

## 2020-07-06 ENCOUNTER — Ambulatory Visit (INDEPENDENT_AMBULATORY_CARE_PROVIDER_SITE_OTHER): Payer: Self-pay | Admitting: Family Medicine

## 2020-07-06 ENCOUNTER — Other Ambulatory Visit: Payer: Self-pay

## 2020-07-06 VITALS — BP 138/83 | HR 106 | Wt 137.4 lb

## 2020-07-06 DIAGNOSIS — I10 Essential (primary) hypertension: Secondary | ICD-10-CM

## 2020-07-06 DIAGNOSIS — Z30011 Encounter for initial prescription of contraceptive pills: Secondary | ICD-10-CM

## 2020-07-06 MED ORDER — NORETHINDRONE 0.35 MG PO TABS: 1.0000 | ORAL_TABLET | Freq: Every day | ORAL | 3 refills | Status: DC

## 2020-07-06 NOTE — Progress Notes (Signed)
   Subjective:    Patient ID: Cassandra Stokes is a 40 y.o. female presenting with Blood Pressure Check  on 07/06/2020  HPI: Here for BP check. On Procardia and Lisinopril. Checking it at home. Reports constant "spiders" in her vision. Denies headache. Also would like birth control. No longer nursing.  Review of Systems  Constitutional: Negative for chills and fever.  Respiratory: Negative for shortness of breath.   Cardiovascular: Negative for chest pain.  Gastrointestinal: Negative for abdominal pain, nausea and vomiting.  Genitourinary: Negative for dysuria.  Skin: Negative for rash.      Objective:    BP 138/83   Pulse (!) 106   Wt 137 lb 6.4 oz (62.3 kg)   BMI 23.58 kg/m  Physical Exam Constitutional:      General: She is not in acute distress.    Appearance: She is well-developed and well-nourished.  HENT:     Head: Normocephalic and atraumatic.  Eyes:     General: No scleral icterus. Cardiovascular:     Rate and Rhythm: Normal rate.  Pulmonary:     Effort: Pulmonary effort is normal.  Abdominal:     Palpations: Abdomen is soft.  Musculoskeletal:     Cervical back: Neck supple.  Skin:    General: Skin is warm and dry.  Neurological:     Mental Status: She is alert and oriented to person, place, and time.  Psychiatric:        Mood and Affect: Mood and affect normal.         Assessment & Plan:   Problem List Items Addressed This Visit      Unprioritized   Essential hypertension    BP is much better. Will continue her BP meds at present dosing as she is tolerating them without side effects. Referral to PCP.       Other Visit Diagnoses    Oral contraception initiation    -  Primary   given BP, begin POPs. Begin Sunday after next menses. Take same time daily.   Relevant Medications   norethindrone (MICRONOR) 0.35 MG tablet      Return in about 3 months (around 10/06/2020) for referral to PCP, a follow-up.  Donnamae Jude 07/06/2020 4:48 PM

## 2020-07-06 NOTE — Patient Instructions (Signed)
https://www.nhlbi.nih.gov/files/docs/public/heart/dash_brief.pdf">  Plan de alimentacin DASH DASH Eating Plan DASH es la sigla en ingls de "Enfoques Alimentarios para Detener la Hipertensin". El plan de alimentacin DASH ha demostrado:  Bajar la presin arterial elevada (hipertensin).  Reducir el riesgo de diabetes tipo 2, enfermedad cardaca y accidente cerebrovascular.  Ayudar a perder peso. Consejos para seguir este plan Leer las etiquetas de los alimentos  Verifique la cantidad de sal (sodio) por porcin en las etiquetas de los alimentos. Elija alimentos con menos del 5 por ciento del valor diario de sodio. Generalmente, los alimentos con menos de 300 miligramos (mg) de sodio por porcin se encuadran dentro de este plan alimentario.  Para encontrar cereales integrales, busque la palabra "integral" como primera palabra en la lista de ingredientes. Al ir de compras  Compre productos en los que en su etiqueta diga: "bajo contenido de sodio" o "sin agregado de sal".  Compre alimentos frescos. Evite los alimentos enlatados y comidas precocidas o congeladas. Al cocinar  Evite agregar sal cuando cocine. Use hierbas o aderezos sin sal, en lugar de sal de mesa o sal marina. Consulte al mdico o farmacutico antes de usar sustitutos de la sal.  No fra los alimentos. A la hora de cocinar los alimentos opte por hornearlos, hervirlos, grillarlos, asarlos al horno y asarlos a la parrilla.  Cocine con aceites cardiosaludables, como oliva, canola, aguacate, soja o girasol. Planificacin de las comidas  Consuma una dieta equilibrada, que incluya lo siguiente: ? 4o ms porciones de frutas y 4 o ms porciones de verduras por da. Trate de que medio plato de cada comida sea de frutas y verduras. ? De 6 a 8porciones de cereales integrales todos los das. ? Menos de 6 onzas (170g) de carne, aves o pescado magros por da. Una porcin de 3 onzas (85g) de carne tiene casi el mismo tamao que un  mazo de cartas. Un huevo equivale a 1 onza (28g). ? De 2 a 3 porciones de productos lcteos descremados por da. Una porcin es 1taza (237ml). ? 1 porcin de frutos secos, semillas o frijoles 5 veces por semana. ? De 2 a 3 porciones de grasas cardiosaludables. Las grasas saludables llamadas cidos grasos omega-3 se encuentran en alimentos como las nueces, las semillas de lino, las leches fortificadas y los huevos. Estas grasas tambin se encuentran en los pescados de agua fra, como la sardina, el salmn y la caballa.  Limite la cantidad que consume de: ? Alimentos enlatados o envasados. ? Alimentos con alto contenido de grasa trans, como algunos alimentos fritos. ? Alimentos con alto contenido de grasa saturada, como carne con grasa. ? Postres y otros dulces, bebidas azucaradas y otros alimentos con azcar agregada. ? Productos lcteos enteros.  No le agregue sal a los alimentos antes de probarlos.  No coma ms de 4 yemas de huevo por semana.  Trate de comer al menos 2 comidas vegetarianas por semana.  Consuma ms comida casera y menos de restaurante, de bares y comida rpida.   Estilo de vida  Cuando coma en un restaurante, pida que preparen su comida con menos sal o, en lo posible, sin nada de sal.  Si bebe alcohol: ? Limite la cantidad que bebe:  De 0 a 1 medida por da para las mujeres que no estn embarazadas.  De 0 a 2 medidas por da para los hombres. ? Est atento a la cantidad de alcohol que hay en las bebidas que toma. En los [place], una medida equivale a una botella   de cerveza de 12oz (355ml), un vaso de vino de 5oz (148ml) o un vaso de una bebida alcohlica de alta graduacin de 1oz (44ml). Informacin general  Evite ingerir ms de 2300 mg de sal por da. Si tiene hipertensin, es posible que necesite reducir la ingesta de sodio a 1,500 mg por da.  Trabaje con su mdico para mantener un peso saludable o perder peso. Pregntele cul es el peso  recomendado para usted.  Realice al menos 30 minutos de ejercicio que haga que se acelere su corazn (ejercicio aerbico) la mayora de los das de la semana. Estas actividades pueden incluir caminar, nadar o andar en bicicleta.  Trabaje con su mdico o nutricionista para ajustar su plan alimentario a sus necesidades calricas personales. Qu alimentos debo comer? Frutas Todas las frutas frescas, congeladas o disecadas. Frutas enlatadas en jugo natural (sin agregado de azcar). Verduras Verduras frescas o congeladas (crudas, al vapor, asadas o grilladas). Jugos de tomate y verduras con bajo contenido de sodio o reducidos en sodio. Salsa y pasta de tomate con bajo contenido de sodio o reducidas en sodio. Verduras enlatadas con bajo contenido de sodio o reducidas en sodio. Granos Pan de salvado o integral. Pasta de salvado o integral. Arroz integral. Avena. Quinua. Trigo burgol. Cereales integrales y con bajo contenido de sodio. Pan pita. Galletitas de agua con bajo contenido de grasa y sodio. Tortillas de harina integral. Carnes y otras protenas Pollo o pavo sin piel. Carne de pollo o de pavo molida. Cerdo desgrasado. Pescado y mariscos. Claras de huevo. Porotos, guisantes o lentejas secos. Frutos secos, mantequilla de frutos secos y semillas sin sal. Frijoles enlatados sin sal. Cortes de carne vacuna magra, desgrasada. Carne precocida o curada magra y baja en sodio, como embutidos o panes de carne. Lcteos Leche descremada (1%) o descremada. Quesos reducidos en grasa, con bajo contenido de grasa o descremados. Queso blanco o ricota sin grasa, con bajo contenido de sodio. Yogur semidescremado o descremado. Queso con bajo contenido de grasa y sodio. Grasas y aceites Margarinas untables que no contengan grasas trans. Aceite vegetal. Mayonesa y aderezos para ensaladas livianos, reducidos en grasa o con bajo contenido de grasas (reducidos en sodio). Aceite de canola, crtamo, oliva, aguacate, soja y  girasol. Aguacate. Alios y condimentos Hierbas. Especias. Mezclas de condimentos sin sal. Otros alimentos Palomitas de maz y pretzels sin sal. Dulces con bajo contenido de grasas. Es posible que los productos que se enumeran ms arriba no constituyan una lista completa de los alimentos y las bebidas que puede tomar. Consulte a un nutricionista para obtener ms informacin. Qu alimentos debo evitar? Frutas Fruta enlatada en almbar liviano o espeso. Frutas cocidas en aceite. Frutas con salsa de crema o mantequilla. Verduras Verduras con crema o fritas. Verduras en salsa de queso. Verduras enlatadas regulares (que no sean con bajo contenido de sodio o reducidas en sodio). Pasta y salsa de tomates enlatadas regulares (que no sean con bajo contenido de sodio o reducidas en sodio). Jugos de tomate y verduras regulares (que no sean con bajo contenido de sodio o reducidos en sodio). Pepinillos. Aceitunas. Granos Productos de panificacin hechos con grasa, como medialunas, magdalenas y algunos panes. Comidas con arroz o pasta seca listas para usar. Carnes y otras protenas Cortes de carne con alto contenido de grasa. Costillas. Carne frita. [place]ocino. [place], salame y otras carnes precocidas o curadas, como embutidos o panes de carne. Grasa de la espalda del cerdo (panceta). Salchicha de cerdo. Frutos secos y semillas con sal. Frijoles   enlatados con agregado de sal. Pescado enlatado o ahumado. Huevos enteros o yemas. Pollo o pavo con piel. Lcteos Leche entera o al 2%, crema y mitad leche y mitad crema. Queso crema entero o con toda su grasa. Yogur entero o endulzado. Quesos con toda su grasa. Sustitutos de cremas no lcteas. Coberturas batidas. Quesos para untar y quesos procesados. Grasas y aceites Mantequilla. Margarina en barra. Manteca de cerdo. Lardo. Mantequilla clarificada. Grasa de panceta. Aceites tropicales como aceite de coco, palmiste o palma. Alios y condimentos Sal de cebolla, sal de  ajo, sal condimentada, sal de mesa y sal marina. Salsa [place]. Salsa trtara. Salsa barbacoa. Salsa teriyaki. Salsa de soja, incluso la que tiene contenido reducido de sodio. Salsa de carne. Salsas en lata y envasadas. Salsa de pescado. Salsa de ostras. Salsa rosada. Rbanos picantes comprados en tiendas. [place]. Mostaza. Saborizantes y tiernizantes para carne. Caldo en cubitos. Salsas picantes. Adobos preelaborados o envasados. Aderezos para tacos preelaborados o envasados. Salsas de pepinillos. Aderezos comunes para ensalada. Otros alimentos Palomitas de maz y pretzels con sal. Es posible que los productos que se enumeran ms arriba no constituyan una lista completa de los alimentos y las bebidas que debe evitar. Consulte a un nutricionista para obtener ms informacin. Dnde buscar ms informacin  National Heart, Lung, and Blood Institute (Instituto Nacional del Corazn, los Pulmones y la Sangre): www.nhlbi.nih.gov  American Heart Association (Asociacin Estadounidense del Corazn): www.heart.org  Academy of Nutrition and Dietetics (Academia de Nutricin y Diettica): www.eatright.org  National Kidney Foundation (Fundacin Nacional del Rin): www.kidney.org Resumen  El plan de alimentacin DASH ha demostrado bajar la presin arterial elevada (hipertensin). Tambin puede reducir el riesgo de diabetes tipo 2, enfermedad cardaca y accidente cerebrovascular.  Cuando siga el plan de alimentacin DASH, trate de comer ms frutas frescas y verduras, cereales integrales, carnes magras, lcteos descremados y grasas cardiosaludables.  Con el plan de alimentacin DASH, deber limitar el consumo de sal (sodio) a 2,300 mg por da. Si tiene hipertensin, es posible que necesite reducir la ingesta de sodio a 1,500 mg por da.  Trabaje con su mdico o nutricionista para ajustar su plan alimentario a sus necesidades calricas personales. Esta informacin no tiene como fin reemplazar el consejo  del mdico. Asegrese de hacerle al mdico cualquier pregunta que tenga. Document Revised: 05/27/2019 Document Reviewed: 05/27/2019 Elsevier Patient Education  2021 Elsevier Inc.  

## 2020-07-06 NOTE — Assessment & Plan Note (Signed)
BP is much better. Will continue her BP meds at present dosing as she is tolerating them without side effects. Referral to PCP.

## 2020-07-25 ENCOUNTER — Other Ambulatory Visit: Payer: Self-pay | Admitting: *Deleted

## 2020-07-25 DIAGNOSIS — Z8759 Personal history of other complications of pregnancy, childbirth and the puerperium: Secondary | ICD-10-CM

## 2020-07-25 MED ORDER — LISINOPRIL 20 MG PO TABS: 20.0000 mg | ORAL_TABLET | Freq: Every day | ORAL | 2 refills | Status: DC

## 2020-07-25 MED ORDER — NIFEDIPINE ER OSMOTIC RELEASE 60 MG PO TB24: 60.0000 mg | ORAL_TABLET | Freq: Every day | ORAL | 2 refills | Status: DC

## 2020-07-25 NOTE — Telephone Encounter (Signed)
Spoke with pt via interpreter services, pt needing refills for her BP meds until she can see a PCP.

## 2020-08-01 ENCOUNTER — Telehealth: Payer: Self-pay

## 2020-08-01 NOTE — Telephone Encounter (Signed)
I return Pt call, Pt was inform that she need to see the PCP first before she can apply for the financial program

## 2020-08-01 NOTE — Telephone Encounter (Signed)
Copied from [place] [id number]. Topic: General - Other >> Jul 28, 2020  2:20 PM Tessa Lerner A wrote: Reason for CRM: Patient would like to be contacted regarding financial counseling Please contact to advise further when possible Patient will be seen as a new patient as [facility]   Patient speaks spanish. She does not have an appt with [facility] until June.

## 2020-08-30 ENCOUNTER — Other Ambulatory Visit: Payer: Self-pay | Admitting: *Deleted

## 2020-08-30 DIAGNOSIS — Z30011 Encounter for initial prescription of contraceptive pills: Secondary | ICD-10-CM

## 2020-08-30 MED ORDER — NORETHINDRONE 0.35 MG PO TABS: 1.0000 | ORAL_TABLET | Freq: Every day | ORAL | 3 refills | Status: AC

## 2020-08-30 NOTE — Progress Notes (Signed)
Pt did not pick up OCP's RX in time so she needs them resent in

## 2020-10-06 ENCOUNTER — Ambulatory Visit (INDEPENDENT_AMBULATORY_CARE_PROVIDER_SITE_OTHER): Payer: Self-pay | Admitting: Primary Care

## 2020-10-06 ENCOUNTER — Encounter (INDEPENDENT_AMBULATORY_CARE_PROVIDER_SITE_OTHER): Payer: Self-pay | Admitting: Primary Care

## 2020-10-06 ENCOUNTER — Other Ambulatory Visit: Payer: Self-pay

## 2020-10-06 VITALS — BP 129/74 | HR 64 | Temp 97.9°F | Ht 64.0 in | Wt 134.4 lb

## 2020-10-06 DIAGNOSIS — Z8759 Personal history of other complications of pregnancy, childbirth and the puerperium: Secondary | ICD-10-CM

## 2020-10-06 DIAGNOSIS — Z7689 Persons encountering health services in other specified circumstances: Secondary | ICD-10-CM

## 2020-10-06 DIAGNOSIS — R42 Dizziness and giddiness: Secondary | ICD-10-CM

## 2020-10-06 DIAGNOSIS — I10 Essential (primary) hypertension: Secondary | ICD-10-CM

## 2020-10-06 DIAGNOSIS — O24415 Gestational diabetes mellitus in pregnancy, controlled by oral hypoglycemic drugs: Secondary | ICD-10-CM

## 2020-10-06 LAB — POCT GLYCOSYLATED HEMOGLOBIN (HGB A1C): Hemoglobin A1C: 5.5 % (ref 4.0–5.6)

## 2020-10-06 MED ORDER — LISINOPRIL 20 MG PO TABS
20.0000 mg | ORAL_TABLET | Freq: Every day | ORAL | 2 refills | Status: DC
Start: 2020-10-06 — End: 2020-10-06

## 2020-10-06 NOTE — Progress Notes (Signed)
New Patient Office Visit  Subjective:  Patient ID: Cassandra Stokes, female    DOB: 06-Mar-1981  Age: 40 y.o. MRN: [id number]  CC:  Chief Complaint  Patient presents with  . New Patient (Initial Visit)    HPI Cassandra Stokes is a 40 year old female who presents for establishment of care.   Past Medical History:  Diagnosis Date  . Fibroid   . GDM (gestational diabetes mellitus), class A1 01/22/2020   Normal 2h postpartum GTT  Dx at 20wks  Current Diabetic Medications:   _0  Aspirin 81 mg daily after 12 weeks (? A2/B GDM)  Required Referrals for A1GDM or A2GDM: _1  Diabetes Education and Testing Supplies _2  Nutrition Cousult  For A2/B GDM or higher classes of DM _3  Diabetes Education and Testing Supplies _4  Nutrition Counsult _5  Fetal ECHO after 20 weeks  _6  Eye exam for retina evaluation   . Gestational diabetes   . History of fetal anomaly in prior pregnancy, currently pregnant 12/28/2019   2021: _7  f/u mfm u/s: eif, [x ] f/u cffdna: low risk panorama, [x ] f/u afp: neg, [x ] f/u genetic counseling consult: declined due to cost  G1 pregnancy: See care everywhere notes.   . History of postpartum hemorrhage, currently pregnant 03/08/2020  . History of severe pre-eclampsia 12/28/2019  . Hypertension   . Preterm labor     Past Surgical History:  Procedure Laterality Date  . CESAREAN SECTION  2017  . CESAREAN SECTION  05/12/2020   Procedure: CESAREAN SECTION;  Surgeon: Donnamae Jude, MD;  Location: [facility] ORS;  Service: Obstetrics;;    Family History  Problem Relation Age of Onset  . Vision loss Father     Social History   Socioeconomic History  . Marital status: Single    Spouse name: Not on file  . Number of children: Not on file  . Years of education: Not on file  . Highest education level: Not on file  Occupational History  . Not on file  Tobacco Use  . Smoking status: Never Smoker  . Smokeless tobacco: Never Used  Vaping Use  .  Vaping Use: Never used  Substance and Sexual Activity  . Alcohol use: No  . Drug use: No  . Sexual activity: Yes    Birth control/protection: None  Other Topics Concern  . Not on file  Social History Narrative  . Not on file   Social Determinants of Health   Financial Resource Strain: Not on file  Food Insecurity: No Food Insecurity  . Worried About Charity fundraiser in the Last Year: Never true  . Ran Out of Food in the Last Year: Never true  Transportation Needs: No Transportation Needs  . Lack of Transportation (Medical): No  . Lack of Transportation (Non-Medical): No  Physical Activity: Not on file  Stress: Not on file  Social Connections: Not on file  Intimate Partner Violence: Not on file    ROS Review of Systems  Neurological: Positive for dizziness.  All other systems reviewed and are negative.   Objective:   Today's Vitals: BP 129/74 (BP Location: Left Arm, Patient Position: Sitting, Cuff Size: Normal)   Pulse 64   Temp 97.9 F (36.6 C) (Temporal)   Ht 5' 4" (1.626 m)   Wt 134 lb 6.4 oz (61 kg)   SpO2 99%   Breastfeeding No   BMI 23.07 kg/m   Physical Exam  Vitals reviewed.  Constitutional:      Appearance: Normal appearance. She is normal weight.  HENT:     Head: Normocephalic.     Right Ear: Tympanic membrane and external ear normal.     Left Ear: Tympanic membrane and external ear normal.     Nose: Nose normal.  Eyes:     Extraocular Movements: Extraocular movements intact.     Pupils: Pupils are equal, round, and reactive to light.  Cardiovascular:     Rate and Rhythm: Normal rate and regular rhythm.  Pulmonary:     Effort: Pulmonary effort is normal.  Abdominal:     General: Abdomen is flat. Bowel sounds are normal.     Palpations: Abdomen is soft.  Musculoskeletal:        General: Normal range of motion.     Cervical back: Normal range of motion.  Skin:    General: Skin is warm and dry.  Neurological:     Mental Status: She is  alert and oriented to person, place, and time.  Psychiatric:        Mood and Affect: Mood normal.        Behavior: Behavior normal.        Thought Content: Thought content normal.        Judgment: Judgment normal.     Assessment & Plan:  Cassandra Stokes was seen today for new patient (initial visit).  Diagnoses and all orders for this visit:  Gestational diabetes mellitus (GDM) in second trimester controlled on oral hypoglycemic drug -     HgB A1c 5.5 per ADA diet guidelines she is not a diabetic.  Explained due to having gestational diabetes she has increased risk of developing type 2 diabetes.  To start monitoring carbohydrates and exercising frequently and will monitor A1c every 6 months to a year  Essential hypertension -     Discontinue: lisinopril (PRINIVIL) 20 MG tablet; Take 1/2 tablet (20 mg total) by mouth daily.f/u with Bp - takes Bp medication and feels light headed. Bp is < goal on lisinopril and stop taking Procardia increased dizziness . Counseled on , low-sodium, DASH diet, medication compliance, 150 minutes of moderate intensity exercise per week. Discussed medication compliance, adverse effects.  Encounter to establish care Establish care with new PCP  Dizzy spells Secondary to blood pressure medicine and probable drop in blood pressure on dual medication.  Although she stopped taking the Procardia she continue the lisinopril 20 and continued to have dizzy spells.  Decrease lisinopril to 10 mg daily.  Explained to cut pill in half and return for BP follow-up at that time will reevaluate blood pressure and frequency of dizzy spells patient in agreement   Outpatient Encounter Medications as of 10/06/2020  Medication Sig  . lisinopril (PRINIVIL) 20 MG tablet Take 1 tablet (20 mg total) by mouth daily.  . Blood Glucose Monitoring Suppl (ACCU-CHEK GUIDE) w/Device KIT 1 Device by Does not apply route 4 (four) times daily. (Patient not taking: Reported on 10/06/2020)  . NIFEdipine  (PROCARDIA XL) 60 MG 24 hr tablet Take 1 tablet (60 mg total) by mouth daily. (Patient not taking: Reported on 10/06/2020)  . norethindrone (MICRONOR) 0.35 MG tablet Take 1 tablet (0.35 mg total) by mouth daily. (Patient not taking: Reported on 10/06/2020)   No facility-administered encounter medications on file as of 10/06/2020.    Follow-up: No follow-ups on file.   Kerin Perna, NP

## 2020-10-06 NOTE — Patient Instructions (Signed)
Hipertensin en los adultos Hypertension, Adult El trmino hipertensin es otra forma de denominar a la presin arterial elevada. La presin arterial elevada fuerza al corazn a trabajar ms para bombear la sangre. Esto puede causar problemas con el paso del [place]. Una lectura de presin arterial est compuesta por 2 nmeros. Hay un nmero superior (sistlico) sobre un nmero inferior (diastlico). Lo ideal es tener la presin arterial por debajo de 120/80. Las elecciones saludables pueden ayudar a Engineer, materials presin arterial, o tal vez necesite medicamentos para bajarla. Cules son las causas? Se desconoce la causa de esta afeccin. Algunas afecciones pueden estar relacionadas con la presin arterial alta. Qu incrementa el riesgo?  Fumar.  Tener diabetes mellitus tipo 2, colesterol alto, o ambos.  No hacer la cantidad suficiente de actividad fsica o ejercicio.  Tener sobrepeso.  Consumir mucha grasa, azcar, caloras o sal (sodio) en su dieta.  Beber alcohol en exceso.  Tener una enfermedad renal a largo plazo (crnica).  Tener antecedentes familiares de presin arterial alta.  Edad. Los riesgos aumentan con la edad.  Raza. El riesgo es mayor para las Retail banker.  Sexo. Antes de los 45aos, los hombres corren ms [employer]. Despus de los 65aos, las mujeres corren ms [employer].  Tener apnea obstructiva del sueo.  Estrs. Cules son los signos o los sntomas?  Es posible que la presin arterial alta puede no cause sntomas. La presin arterial muy alta (crisis hipertensiva) puede provocar: ? Dolor de [place]. ? Sensaciones de preocupacin o nerviosismo (ansiedad). ? Falta de aire. ? Hemorragia nasal. ? Sensacin de Engineer, site (nuseas). ? Vmitos. ? Cambios en la forma de ver. ? Dolor muy intenso en el pecho. ? Convulsiones. Cmo se trata?  Esta afeccin se trata haciendo cambios saludables en el estilo de  vida, por ejemplo: ? Consumir alimentos saludables. ? Hacer ms ejercicio. ? Beber menos alcohol.  El mdico puede recetarle medicamentos si los cambios en el estilo de vida no son suficientes para Child psychotherapist la presin arterial y si: ? El nmero de arriba est por encima de 130. ? El nmero de abajo est por encima de 80.  Su presin arterial personal ideal puede variar. Siga estas instrucciones en su casa: Comida y bebida  Si se lo dicen, siga el plan de alimentacin de DASH (Dietary Approaches to Stop Hypertension, Maneras de alimentarse para detener la hipertensin). Para seguir este plan: ? Llene la mitad del plato de cada comida con frutas y verduras. ? Llene un cuarto del plato de cada comida con cereales integrales. Los cereales integrales incluyen pasta integral, arroz integral y pan integral. ? Coma y beba productos lcteos con bajo contenido de grasa, como leche descremada o yogur bajo en grasas. ? Llene un cuarto del plato de cada comida con protenas bajas en grasa (magras). Las protenas bajas en grasa incluyen pescado, pollo sin piel, huevos, frijoles y tofu. ? Evite consumir carne grasa, carne curada y procesada, o pollo con piel. ? Evite consumir alimentos prehechos o procesados.  Consuma menos de 1500 mg de sal por da.  No beba alcohol si: ? El mdico le indica que no lo haga. ? Est embarazada, puede estar embarazada o est tratando de quedar embarazada.  Si bebe alcohol: ? Limite la cantidad que bebe a lo siguiente:  De 0 a 1 medida por da para las mujeres.  De 0 a 2 medidas por da para los hombres. ? Est atento a la  cantidad de alcohol que hay en las bebidas que toma. En los [place], una medida equivale a una botella de cerveza de 12oz (359ml), un vaso de vino de 5oz (115ml) o un vaso de una bebida alcohlica de alta graduacin de 1oz (65ml).   Estilo de vida  Trabaje con su mdico para mantenerse en un peso saludable o para perder  peso. Pregntele a su mdico cul es el peso recomendable para usted.  Haga al menos 58minutos de ejercicio la [employer] de la [place]. Estos pueden incluir caminar, nadar o andar en bicicleta.  Realice al menos 30 minutos de ejercicio que fortalezca sus msculos (ejercicios de resistencia) al menos 3 das a la [place]. Estos pueden incluir levantar pesas o hacer [place].  No consuma ningn producto que contenga nicotina o tabaco, como cigarrillos, cigarrillos electrnicos y tabaco de Higher education careers adviser. Si necesita ayuda para dejar de fumar, consulte al [employer].  Controle su presin arterial en su casa tal como le indic el mdico.  Concurra a todas las visitas de seguimiento como se lo haya indicado el mdico. Esto es importante.   Medicamentos  [employer] de venta libre y los recetados solamente como se lo haya indicado el mdico. Siga cuidadosamente las indicaciones.  No omita las dosis de medicamentos para la presin arterial. Los medicamentos pierden eficacia si omite dosis. El hecho de omitir las dosis tambin [place] el riesgo de otros problemas.  Pregntele a su mdico a qu efectos secundarios o reacciones a los Careers information officer. Comunquese con un mdico si:  Piensa que tiene [place] reaccin a los medicamentos que est tomando.  Tiene dolores de cabeza frecuentes (recurrentes).  Se siente mareado.  Tiene hinchazn en los tobillos.  Tiene problemas de visin. Solicite ayuda inmediatamente si:  Siente un dolor de cabeza muy intenso.  Empieza a sentirse desorientado (confundido).  Se siente dbil o adormecido.  Siente que va a desmayarse.  Tiene un dolor muy intenso en las siguientes zonas: ? Pecho. ? Vientre (abdomen).  Vomita ms de una vez.  Tiene dificultad para respirar. Resumen  El trmino hipertensin es otra forma de denominar a la presin arterial elevada.  La presin arterial elevada fuerza al corazn a trabajar ms para  bombear la sangre.  Para la [employer], una presin arterial normal es menor que 120/80.  Las decisiones saludables pueden ayudarle a disminuir su presin arterial. Si no puede bajar su presin arterial mediante decisiones saludables, es posible que deba tomar medicamentos. Esta informacin no tiene Marine scientist el consejo del mdico. Asegrese de hacerle al mdico cualquier pregunta que tenga. Document Revised: 02/05/2018 Document Reviewed: 02/05/2018 Elsevier Patient Education  2021 [employer].

## 2020-11-14 ENCOUNTER — Encounter (INDEPENDENT_AMBULATORY_CARE_PROVIDER_SITE_OTHER): Payer: Self-pay | Admitting: Primary Care

## 2020-11-14 ENCOUNTER — Other Ambulatory Visit: Payer: Self-pay

## 2020-11-14 ENCOUNTER — Ambulatory Visit (INDEPENDENT_AMBULATORY_CARE_PROVIDER_SITE_OTHER): Payer: Self-pay | Admitting: Primary Care

## 2020-11-14 VITALS — BP 122/80 | HR 67 | Temp 97.7°F | Resp 18 | Wt 130.0 lb

## 2020-11-14 DIAGNOSIS — Z0130 Encounter for examination of blood pressure without abnormal findings: Secondary | ICD-10-CM

## 2020-11-14 DIAGNOSIS — I10 Essential (primary) hypertension: Secondary | ICD-10-CM

## 2020-11-14 DIAGNOSIS — K219 Gastro-esophageal reflux disease without esophagitis: Secondary | ICD-10-CM

## 2020-11-14 DIAGNOSIS — Z760 Encounter for issue of repeat prescription: Secondary | ICD-10-CM

## 2020-11-14 MED ORDER — LISINOPRIL 20 MG PO TABS: 20.0000 mg | ORAL_TABLET | Freq: Every day | ORAL | 3 refills | Status: AC

## 2020-11-14 NOTE — Progress Notes (Signed)
CC Bp f/u    HPI [name] y.o.Hispanic female [facility] [phone] )  presents for Blood pressure f/u. Denies shortness of breath, headaches, chest pain or lower extremity edema   Past Medical History:  Diagnosis Date   Fibroid    GDM (gestational diabetes mellitus), class A1 01/22/2020   Normal 2h postpartum GTT  Dx at 20wks  Current Diabetic Medications:   [ ] Aspirin 81 mg daily after 12 weeks (? A2/B GDM)  Required Referrals for A1GDM or A2GDM: [ ] Diabetes Education and Testing Supplies [ ] Nutrition Cousult  For A2/B GDM or higher classes of DM [ ] Diabetes Education and Equities trader [ ] Nutrition Counsult [ ] Fetal ECHO after 20 weeks  [ ] Eye exam for retina evaluation    Gestational diabetes    History of fetal anomaly in prior pregnancy, currently pregnant 12/28/2019   2021: [ ] f/u mfm u/s: eif, [x ] f/u cffdna: low risk panorama, [x ] f/u afp: neg, [x ] f/u genetic counseling consult: declined due to cost  G1 pregnancy: See care everywhere notes.    History of postpartum hemorrhage, currently pregnant 03/08/2020   History of severe pre-eclampsia 12/28/2019   Hypertension    Preterm labor      No Known Allergies    Current Outpatient Medications on File Prior to Visit  Medication Sig Dispense Refill   Blood Glucose Monitoring Suppl (ACCU-CHEK GUIDE) w/Device KIT 1 Device by Does not apply route 4 (four) times daily. (Patient not taking: Reported on 10/06/2020) 1 kit 0   norethindrone (MICRONOR) 0.35 MG tablet Take 1 tablet (0.35 mg total) by mouth daily. (Patient not taking: Reported on 10/06/2020) 84 tablet 3   No current facility-administered medications on file prior to visit.    ROS: all negative except above.   Physical Exam: Filed Weights   11/14/20 0917  Weight: 130 lb (59 kg)   BP 122/80 (BP Location: Left Arm, Patient Position: Sitting, Cuff Size: Normal)   Pulse 67   Temp 97.7 F (36.5 C) (Oral)   Resp 18   Wt 130 lb (59 kg)    SpO2 100%   BMI 22.31 kg/m  General Appearance: Well nourished, in no apparent distress. Eyes: PERRLA, EOMs, conjunctiva no swelling or erythema Sinuses: No Frontal/maxillary tenderness ENT/Mouth: . Hearing normal.  Neck: Supple, thyroid normal.  Respiratory: Respiratory effort normal, BS equal bilaterally without rales, rhonchi, wheezing or stridor.  Cardio: RRR with no MRGs. Brisk peripheral pulses without edema.  Abdomen: Soft, + BS.  Non tender, no guarding, rebound, hernias, masses. Lymphatics: Non tender without lymphadenopathy.  Musculoskeletal: Full ROM, 5/5 strength, normal gait.  Skin: Warm, dry without rashes, lesions, ecchymosis.  Neuro: Cranial nerves intact. Normal muscle tone, no cerebellar symptoms. Sensation intact.  Psych: Awake and oriented X 3, normal affect, Insight and Judgment appropriate.    Diagnoses and all orders for this visit:  Blood pressure check Blood pressure is at  goal of less than 130/80, low-sodium, DASH diet, medication compliance, 150 minutes of moderate intensity exercise per week. Discussed medication compliance, adverse effects. Today she explains continue to take Procardia XL - start having strange feeling racing heart and stop. She is taking lisinopril 16m prescribed by OBGYN Requesting a refill   Gastroesophageal reflux disease without esophagitis Gastroparesis is also called delayed gastric emptying it consist of weak muscular contractions (peristalsis) of the stomach resulting in food and liquid remaining in the stomach for prolonged  periods of time.  Stomach contents does exist more slowly into the duodenum digestive tract.  Symptoms can include nausea vomiting abdominal pain feeling full soon after or beginning to eat, abdominal bloating and heartburn.   Medication refill Lisinopril 59m   MKerin Perna NP 9:28 AM [facility]

## 2020-11-14 NOTE — Patient Instructions (Addendum)
Opciones de alimentos para pacientes adultos con enfermedad de reflujogastroesofgico Food Choices for Gastroesophageal Reflux Disease, Adult Si tiene enfermedad de reflujo gastroesofgico (ERGE), los alimentos que consume y los hbitos de alimentacin son muy importantes. Elegir los alimentos adecuados puede ayudar a [employer]. Piense en consultar a un experto en alimentacin (nutricionista) para que lo ayude a Warehouse manager. Consejos para seguir Photographer las etiquetas de los alimentos Elija alimentos que tengan bajo contenido de grasas saturadas. Los alimentos que pueden ayudar con los sntomas incluyen los siguientes: Alimentos que tienen menos del 5 % de los valores diarios (VD) de grasa. Alimentos que tienen 0 gramos de grasas trans. Al cocinar No frer los alimentos. Cocinar la comida al horno, al vapor, a la plancha o en la parrilla. Estos son mtodos que no necesitan mucha grasa para Audiological scientist. Para agregar sabor, trate de consumir hierbas con bajo contenido de picante y [place]. Planificacin de las comidas  Elegir alimentos saludables con bajo contenido de [place], por ejemplo: Frutas y vegetales. Cereales integrales. Productos lcteos con bajo contenido de [place]. Lysbeth Galas, pescado y aves. Haga comidas pequeas frecuentes en lugar de 3 comidas abundantes al da. Coma lentamente, en un lugar donde est relajado. Evite agacharse o recostarse hasta 2 o 3 horas despus de haber comido. Limite los alimentos con alto contenido graso como las carnes grasas o los alimentos fritos. Limite su ingesta de alimentos grasos, como aceites, [place] y [place]. Evite lo siguiente si el mdico se lo indica: Consumir alimentos que le ocasionen sntomas. Pueden ser distintos para cada persona. Lleve un registro de los alimentos para identificar aquellos que le causen sntomas. Alcohol. Beber mucha cantidad de lquido con las comidas. Comer 2 o 3 horas antes de  acostarse.  Estilo de vida Mantenga un peso saludable. Pregntele a su mdico cul es el peso saludable para usted. Si necesita perder peso, hable con su mdico para hacerlo de manera segura. Haga actividad fsica durante, al menos, 30 minutos 5 das por semana o ms, o como se lo haya indicado el mdico. Use ropa suelta. No fume ni consuma ningn producto que contenga nicotina o tabaco. Si necesita ayuda para dejar de fumar, consulte al mdico. Duerma con la cabecera de la cama ms elevada que los pies. Use una cua debajo del colchn o bloques debajo del armazn de la cama para Theatre manager la cabecera de la cama elevada. Mastique chicle sin azcar despus de las comidas. Qu alimentos debo comer?  Siga una dieta saludable y bien equilibrada que incluya abundantes frutas, verduras, cereales integrales, productos lcteos descremados, carnes magras,pescado y aves. Cada persona es diferente. Los alimentos que pueden causar sntomas en una persona pueden no causar sntomas en otra persona. Trabaje con el mdico para hallar alimentos que seanseguros para usted. Es posible que los productos que se enumeran ms [place] no constituyan una lista completa de lo que puede comer y Electronics engineer. Pngase en contacto con un experto en alimentos para conocer ms opciones. Qu alimentos debo evitar? Limitar algunos de estos alimentos puede ayudar a Chief Technology Officer los sntomas de [place]. Cada persona es diferente. Consulte a un experto en alimentacin o a su mdico para que lo ayude a [employer] alimentos exactos que debe evitar, silos hubiere. Frutas Cualquier fruta que est preparada con grasa agregada. Cualquier fruta que le ocasione sntomas. Para algunas personas, estas pueden incluir, las frutasctricas como naranja, pomelo, pia y limn. Verduras Verduras fritas en abundante aceite. Papas fritas. Cualquier verdura que est  preparada con grasa agregada. Cualquier verdura que le ocasione sntomas. Para algunas personas,  estas pueden incluir tomates y productos con tomate, ajes,cebollas y [place], y rbanos picantes. Granos Pasteles o panes sin levadura con grasa agregada. Carnes y otras protenas Carnes de alto contenido graso como carne grasa de vaca o cerdo, salchichas, costillas, jamn, salchicha, salame y tocino. Carnes o protenas fritas, lo que incluye pescado frito y pollo frito. Frutos secos y Engineer, mining de frutossecos, en grandes cantidades. Lcteos Leche entera y [place] con chocolate. Phoebe Sharps. Crema. Helado. Queso crema.Batidos con [employer]. Grasas y [employer]. Margarina. Lardo. Mantequilla clarificada. Bebidas Caf y t negro, con o sin cafena. Bebidas con gas. Refrescos. Bebidas energizantes. Jugo de fruta hecho con frutas cidas, como naranja o pomelo.Jugo de tomate. Bebidas alcohlicas. Dulces y postres Chocolate y cacao. Rosquillas. Alios y condimentos Pimienta. Menta y mentol. Sal agregada. Cualquier condimento, hierbas o aderezos que le ocasionen sntomas. Para algunas personas, esto puede incluircurry, salsa picante o aderezos para ensalada a base de vinagre. Es posible que los productos que se enumeran ms arriba no constituyan una lista completa de lo que no debera comer y Electronics engineer. Pngase en contacto con un experto en alimentos para conocer ms opciones. Preguntas para hacerle al [employer] Los cambios en la dieta y en el estilo de vida a menudo son los primeros pasos que se toman para Air cabin crew los sntomas de [place]. Si los [employer]ley[employer]son dieta y Estate manager/land agent de vida no ayudan, hable con el mdico sobre el uso de medicamentos. Dnde buscar ms informacin Control and instrumentation engineer for Gastrointestinal Disorders (Fundacin Internacional para los Trastornos Gastrointestinales): aboutgerd.org Resumen Si tiene [employer], las elecciones de alimentos y el [place] de vida son muy importantes para ayudar a Herbalist sntomas. Haga comidas pequeas durante Psychiatrist de 3 comidas abundantes. Coma  lentamente y en un lugar donde est relajado. Evite agacharse o recostarse hasta 2 o 3 horas despus de haber comido. Limite los alimentos con alto contenido graso como las carnes grasas o los alimentos fritos. Esta informacin no tiene Marine scientist el consejo del mdico. Asegresede hacerle al mdico cualquier pregunta que tenga. Document Revised: 12/03/2019 Document Reviewed: 12/03/2019 Elsevier Patient Education  2022 [place]. Hipertensin en los adultos Hypertension, Adult El trmino hipertensin es otra forma de denominar a la presin arterial elevada. La presin arterial elevada fuerza al corazn a trabajar ms parabombear la sangre. Esto puede causar problemas con el paso del [place]. Una lectura de presin arterial est compuesta por 2 nmeros. Hay un nmero superior (sistlico) sobre un nmero inferior (diastlico). Lo ideal es tener la presin arterial por debajo de 120/80. Las elecciones saludables pueden ayudar a Engineer, materials presin arterial, o tal vez necesitemedicamentos para bajarla. Cules son las causas? Se desconoce la causa de esta afeccin. Algunas afecciones pueden estarrelacionadas con la presin arterial alta. Qu incrementa el riesgo? Fumar. Tener diabetes mellitus tipo 2, colesterol alto, o ambos. No hacer la cantidad suficiente de actividad fsica o ejercicio. Tener sobrepeso. Consumir mucha grasa, azcar, caloras o sal (sodio) en su dieta. Beber alcohol en exceso. Tener una enfermedad renal a largo plazo (crnica). Tener antecedentes familiares de presin arterial alta. Edad. Los riesgos aumentan con la edad. Raza. El riesgo es mayor para las Retail banker. Sexo. Antes de los 45 aos, los hombres corren ms E[employer]. Despus de los 65 aos, las mujeres corren ms [employer]. Tener apnea obstructiva del sueo. Estrs. Cules son los signos o  los sntomas? Es posible que la presin arterial alta puede no cause  sntomas. La presin arterial muy alta (crisis hipertensiva) puede provocar: Dolor de cabeza. Sensaciones de preocupacin o nerviosismo (ansiedad). Falta de aire. Hemorragia nasal. Sensacin de malestar en el estmago (nuseas). Vmitos. Cambios en la forma de ver. Dolor muy intenso en el pecho. Convulsiones. Cmo se trata? Esta afeccin se trata haciendo cambios saludables en el estilo de vida, por ejemplo: Consumir alimentos saludables. Hacer ms ejercicio. Beber menos alcohol. El mdico puede recetarle medicamentos si los cambios en el estilo de vida no son suficientes para Child psychotherapist la presin arterial y si: El nmero de arriba est por encima de 130. El nmero de abajo est por encima de 80. Su presin arterial personal ideal puede variar. Siga estas instrucciones en su casa: Comida y bebida  Si se lo dicen, siga el plan de alimentacin de DASH (Dietary Approaches to Stop Hypertension, Maneras de alimentarse para detener la hipertensin). Para seguir este plan: Llene la mitad del plato de cada comida con frutas y verduras. Llene un cuarto del plato de cada comida con cereales integrales. Los cereales integrales incluyen pasta integral, arroz integral y pan integral. Coma y beba productos lcteos con bajo contenido de grasa, como leche descremada o yogur bajo en grasas. Llene un cuarto del plato de cada comida con protenas bajas en grasa (magras). Las protenas bajas en grasa incluyen pescado, pollo sin piel, huevos, frijoles y tofu. Evite consumir carne grasa, carne curada y procesada, o pollo con piel. Evite consumir alimentos prehechos o procesados. Consuma menos de 1500 mg de sal por da. No beba alcohol si: El mdico le indica que no lo haga. Est embarazada, puede estar embarazada o est tratando de [place]. Si bebe alcohol: Limite la cantidad que bebe a lo siguiente: De 0 a 1 medida por da para las mujeres. De 0 a 2 medidas por da para los  hombres. Est atento a la cantidad de alcohol que hay en las bebidas que toma. En los [place], una medida equivale a una botella de cerveza de 12 oz (355 ml), un vaso de vino de 5 oz (148 ml) o un vaso de una bebida alcohlica de alta graduacin de 1 oz (44 ml).  Estilo de vida  Trabaje con su mdico para mantenerse en un peso saludable o para perder peso. Pregntele a su mdico cul es el peso recomendable para usted. Haga al menos 30 minutos de ejercicio la [employer] de la [place]. Estos pueden incluir caminar, nadar o andar en bicicleta. Realice al menos 30 minutos de ejercicio que fortalezca sus msculos (ejercicios de resistencia) al menos 3 das a la [place]. Estos pueden incluir levantar pesas o hacer [place]. No consuma ningn producto que contenga nicotina o tabaco, como cigarrillos, cigarrillos electrnicos y tabaco de Higher education careers adviser. Si necesita ayuda para dejar de fumar, consulte al [employer]. Controle su presin arterial en su casa tal como le indic el mdico. Concurra a todas las visitas de seguimiento como se lo haya indicado el mdico. Esto es importante.  Medicamentos Delphi de venta libre y los recetados solamente como se lo haya indicado el mdico. Siga cuidadosamente las indicaciones. No omita las dosis de medicamentos para la presin arterial. Los medicamentos pierden eficacia si omite dosis. El hecho de omitir las dosis tambin [place] el riesgo de otros problemas. Pregntele a su mdico a qu efectos secundarios o reacciones a los Careers information officer. Comunquese con un mdico  si: Piensa que tiene una reaccin a los medicamentos que est tomando. Tiene dolores de cabeza frecuentes (recurrentes). Se siente mareado. Tiene hinchazn en los tobillos. Tiene problemas de visin. Solicite ayuda inmediatamente si: Siente un dolor de cabeza muy intenso. Empieza a sentirse desorientado (confundido). Se siente dbil o adormecido. Siente que va  a desmayarse. Tiene un dolor muy intenso en las siguientes zonas: Pecho. Vientre (abdomen). Vomita ms de una vez. Tiene dificultad para respirar. Resumen El trmino hipertensin es otra forma de denominar a la presin arterial elevada. La presin arterial elevada fuerza al corazn a trabajar ms para bombear la sangre. Para la [employer], una presin arterial normal es menor que 120/80. Las decisiones saludables pueden ayudarle a disminuir su presin arterial. Si no puede bajar su presin arterial mediante decisiones saludables, es posible que deba tomar medicamentos. Esta informacin no tiene Marine scientist el consejo del mdico. Asegresede hacerle al mdico cualquier pregunta que tenga. Document Revised: 02/05/2018 Document Reviewed: 02/05/2018 Elsevier Patient Education  Cassandra Stokes.

## 2020-11-14 NOTE — Progress Notes (Deleted)
.@  LOGO@  CC:  HPI: @FNAME @ @LNAME @ is a @AGE @ @SEX @ here today for a follow up visit.  Patient has past medical history of   @SUBJECTIVEEND @   @PROBOVERVIEW @  ALLERGIES: @ALLERGY @  PAST MEDICAL HISTORY: @PMH @  SOCIAL HISTORY @SOC @  FAMILY HISTORY @FAMHX @   MEDICATIONS AT HOME: @HMEDS @    Review of Systems: Constitutional: Negative for fever, chills, diaphoresis, activity change, appetite change and fatigue. HENT: Negative for ear pain, nosebleeds, congestion, facial swelling, rhinorrhea, neck pain, neck stiffness and ear discharge.  Eyes: Negative for pain, discharge, redness, itching and visual disturbance. Respiratory: Negative for cough, choking, chest tightness, shortness of breath, wheezing and stridor.  Cardiovascular: Negative for chest pain, palpitations and leg swelling. Gastrointestinal: Negative for abdominal distention. Genitourinary: Negative for dysuria, urgency, frequency, hematuria, flank pain, decreased urine volume, difficulty urinating and dyspareunia.  Musculoskeletal: Negative for back pain, joint swelling, arthralgias and gait problem. Neurological: Negative for dizziness, tremors, seizures, syncope, facial asymmetry, speech difficulty, weakness, light-headedness, numbness and headaches.  Hematological: Negative for adenopathy. Does not bruise/bleed easily. Psychiatric/Behavioral: Negative for hallucinations, behavioral problems, confusion, dysphoric mood, decreased concentration and agitation.    Objective:  @VITALS @  Physical Exam: Constitutional: Patient appears well-developed and well-nourished. No distress. HENT: Normocephalic, atraumatic, External right and left ear normal. Oropharynx is clear and moist.  Eyes: Conjunctivae and EOM are normal. PERRLA, no scleral icterus. Neck: Normal ROM. Neck supple. No JVD. No tracheal deviation. No thyromegaly. CVS: RRR, S1/S2 +, no murmurs, no gallops, no carotid bruit.  Pulmonary: Effort and breath  sounds normal, no stridor, rhonchi, wheezes, rales.  Abdominal: Soft. BS +,  no distension, tenderness, rebound or guarding.  Musculoskeletal: Normal range of motion. No edema and no tenderness.  Neuro: Alert. Normal reflexes, muscle tone coordination. No cranial nerve deficit. Skin: Skin is warm and dry. No rash noted. Not diaphoretic. No erythema. No pallor. Psychiatric: Normal mood and affect. Behavior, judgment, thought content normal.  @RESUFAST (WBC,HGB,HCT,MCV,PLT)@ @RESUFAST (CREATININE,BUN,NA,K,CL,CO2)@ @RESUFAST (HGBA1C)@   Lipid Panel  @BRIEFLABTABLE (chol,trig,hdl,cholhdl,vldl,ldlcalc)@  @OBJECTIVEEND @   Assessment and plan:           Patient has been counseled extensively about nutrition and exercise. Other issues discussed during this visit include: low cholesterol diet, weight control with exercise at least 150 minutes per week, foot care, annual eye examinations at Ophthalmology, importance of adherence with medications and regular follow-up.    @FOLLOWUP @  The patient was given clear instructions to go to ER or return to medical center if symptoms don't improve, worsen or new problems develop. The patient verbalized understanding.      @ME @, FNP-BC, [facility] and [facility] [place], [place]

## 2020-12-11 ENCOUNTER — Ambulatory Visit (INDEPENDENT_AMBULATORY_CARE_PROVIDER_SITE_OTHER): Payer: Self-pay | Admitting: Nurse Practitioner

## 2021-01-12 ENCOUNTER — Ambulatory Visit: Payer: Self-pay | Admitting: Internal Medicine

## 2021-02-15 ENCOUNTER — Ambulatory Visit (INDEPENDENT_AMBULATORY_CARE_PROVIDER_SITE_OTHER): Payer: Self-pay | Admitting: Primary Care

## 2021-05-04 ENCOUNTER — Ambulatory Visit (INDEPENDENT_AMBULATORY_CARE_PROVIDER_SITE_OTHER): Payer: Self-pay | Admitting: Primary Care

## 2021-09-17 ENCOUNTER — Other Ambulatory Visit (INDEPENDENT_AMBULATORY_CARE_PROVIDER_SITE_OTHER): Payer: Self-pay | Admitting: Primary Care

## 2021-09-17 DIAGNOSIS — I10 Essential (primary) hypertension: Secondary | ICD-10-CM

## 2021-09-17 NOTE — Telephone Encounter (Signed)
Medication Refill - Medication: lisinopril (ZESTRIL) 20 MG tablet ? ?Has the patient contacted their pharmacy? No. No more refills.  ? ?(Agent: If no, request that the patient contact the pharmacy for the refill. If patient does not wish to contact the pharmacy document the reason why and proceed with request.) ? ? ?Preferred Pharmacy (with phone number or street name):  ? ?[place], [place] - [place]  ?[address] [place] [address]  ?Phone: [phone] Fax: [phone]  ?Hours: Not open 24 hours  ? ?Has the patient been seen for an appointment in the last year OR does the patient have an upcoming appointment? No. ? ?Agent: Please be advised that RX refills may take up to 3 business days. We ask that you follow-up with your pharmacy.  ?

## 2021-09-18 ENCOUNTER — Other Ambulatory Visit (INDEPENDENT_AMBULATORY_CARE_PROVIDER_SITE_OTHER): Payer: Self-pay | Admitting: Primary Care

## 2021-09-18 DIAGNOSIS — I10 Essential (primary) hypertension: Secondary | ICD-10-CM

## 2021-09-18 NOTE — Telephone Encounter (Signed)
Copied from [place] [phone]. Topic: General - Other ?>> Sep 18, 2021  4:52 PM Tessa Lerner A wrote: ?Reason for CRM: Medication Refill - Medication: lisinopril (ZESTRIL) 20 MG tablet [[id number]]  ? ?Has the patient contacted their pharmacy? Yes.   ?(Agent: If no, request that the patient contact the pharmacy for the refill. If patient does not wish to contact the pharmacy document the reason why and proceed with request.) ?(Agent: If yes, when and what did the pharmacy advise?) ? ?Preferred Pharmacy (with phone number or street name): [place], [place] ?[address] [place] [address] ?Phone: [phone] Fax: [phone] ?Hours: Not open 24 hours ? ?Has the patient been seen for an appointment in the last year OR does the patient have an upcoming appointment? Yes.   ? ?Agent: Please be advised that RX refills may take up to 3 business days. We ask that you follow-up with your pharmacy. ?

## 2021-09-18 NOTE — Telephone Encounter (Signed)
Requested medication (s) are due for refill today - patient states she does not have RF ? ?Requested medication (s) are on the active medication list - yes ? ?Future visit scheduled -yes ? ?Last refill: 11/14/20 #90 3RF ? ?Notes to clinic: Call to patient- patient scheduled medication follow up appointment- she does not have transportation and request help with that. Fails lab/visit protocol ? ?Requested Prescriptions  ?Pending Prescriptions Disp Refills  ? lisinopril (ZESTRIL) 20 MG tablet 90 tablet 3  ?  Sig: Take 1 tablet (20 mg total) by mouth daily.  ?  ? Cardiovascular:  ACE Inhibitors Failed - 09/17/2021  2:23 PM  ?  ?  Failed - Cr in normal range and within 180 days  ?  Creat  ?Date Value Ref Range Status  ?07/20/2015 0.40 (L) 0.50 - 1.10 mg/dL Final  ? ?Creatinine, Ser  ?Date Value Ref Range Status  ?05/19/2020 0.54 (L) 0.57 - 1.00 mg/dL Final  ? ?Creatinine, Urine  ?Date Value Ref Range Status  ?02/16/2020 <10 mg/dL Final  ?  Comment:  ?  REPEATED TO VERIFY  ?   ?  ?  Failed - K in normal range and within 180 days  ?  Potassium  ?Date Value Ref Range Status  ?05/19/2020 3.3 (L) 3.5 - 5.2 mmol/L Final  ?   ?  ?  Failed - Valid encounter within last 6 months  ?  Recent Outpatient Visits   ? ?      ? 10 months ago Blood pressure check  ? [facility] Kerin Perna, NP  ? 11 months ago Gestational diabetes mellitus (GDM) in second trimester controlled on oral hypoglycemic drug  ? [facility] Kerin Perna, NP  ? ?  ?  ?Future Appointments   ? ?        ? In 1 month Edwards, Milford Cage, NP [place]  ? ?  ? ? ?  ?  ?  Passed - Patient is not pregnant  ?  ?  Passed - Last BP in normal range  ?  BP Readings from Last 1 Encounters:  ?11/14/20 122/80  ?   ?  ?  ? ? ? ?Requested Prescriptions  ?Pending Prescriptions Disp Refills  ? lisinopril (ZESTRIL) 20 MG tablet 90 tablet 3  ?  Sig: Take 1 tablet (20 mg total) by mouth daily.  ?  ?  Cardiovascular:  ACE Inhibitors Failed - 09/17/2021  2:23 PM  ?  ?  Failed - Cr in normal range and within 180 days  ?  Creat  ?Date Value Ref Range Status  ?07/20/2015 0.40 (L) 0.50 - 1.10 mg/dL Final  ? ?Creatinine, Ser  ?Date Value Ref Range Status  ?05/19/2020 0.54 (L) 0.57 - 1.00 mg/dL Final  ? ?Creatinine, Urine  ?Date Value Ref Range Status  ?02/16/2020 <10 mg/dL Final  ?  Comment:  ?  REPEATED TO VERIFY  ?   ?  ?  Failed - K in normal range and within 180 days  ?  Potassium  ?Date Value Ref Range Status  ?05/19/2020 3.3 (L) 3.5 - 5.2 mmol/L Final  ?   ?  ?  Failed - Valid encounter within last 6 months  ?  Recent Outpatient Visits   ? ?      ? 10 months ago Blood pressure check  ? [place] [facility] Kerin Perna, NP  ? 11 months  ago Gestational diabetes mellitus (GDM) in second trimester controlled on oral hypoglycemic drug  ? [facility] Kerin Perna, NP  ? ?  ?  ?Future Appointments   ? ?        ? In 1 month Edwards, Milford Cage, NP [place]  ? ?  ? ? ?  ?  ?  Passed - Patient is not pregnant  ?  ?  Passed - Last BP in normal range  ?  BP Readings from Last 1 Encounters:  ?11/14/20 122/80  ?   ?  ?  ? ? ? ?

## 2021-09-19 NOTE — Telephone Encounter (Signed)
Call to pharmacy- verified Rx has RF- they will fill for patient. ?Requested Prescriptions  ?Pending Prescriptions Disp Refills  ?? lisinopril (ZESTRIL) 20 MG tablet 90 tablet 3  ?  Sig: Take 1 tablet (20 mg total) by mouth daily.  ?  ? Cardiovascular:  ACE Inhibitors Failed - 09/18/2021  5:24 PM  ?  ?  Failed - Cr in normal range and within 180 days  ?  Creat  ?Date Value Ref Range Status  ?07/20/2015 0.40 (L) 0.50 - 1.10 mg/dL Final  ? ?Creatinine, Ser  ?Date Value Ref Range Status  ?05/19/2020 0.54 (L) 0.57 - 1.00 mg/dL Final  ? ?Creatinine, Urine  ?Date Value Ref Range Status  ?02/16/2020 <10 mg/dL Final  ?  Comment:  ?  REPEATED TO VERIFY  ?   ?  ?  Failed - K in normal range and within 180 days  ?  Potassium  ?Date Value Ref Range Status  ?05/19/2020 3.3 (L) 3.5 - 5.2 mmol/L Final  ?   ?  ?  Failed - Valid encounter within last 6 months  ?  Recent Outpatient Visits   ?      ? 10 months ago Blood pressure check  ? [facility] Kerin Perna, NP  ? 11 months ago Gestational diabetes mellitus (GDM) in second trimester controlled on oral hypoglycemic drug  ? [facility] Kerin Perna, NP  ?  ?  ?Future Appointments   ?        ? In 4 weeks Kerin Perna, NP [place]  ?  ? ?  ?  ?  Passed - Patient is not pregnant  ?  ?  Passed - Last BP in normal range  ?  BP Readings from Last 1 Encounters:  ?11/14/20 122/80  ?   ?  ?  ? ?

## 2021-10-18 ENCOUNTER — Ambulatory Visit (INDEPENDENT_AMBULATORY_CARE_PROVIDER_SITE_OTHER): Payer: Self-pay | Admitting: Primary Care

## 2022-01-03 ENCOUNTER — Other Ambulatory Visit (INDEPENDENT_AMBULATORY_CARE_PROVIDER_SITE_OTHER): Payer: Self-pay | Admitting: Primary Care

## 2022-01-03 DIAGNOSIS — I10 Essential (primary) hypertension: Secondary | ICD-10-CM

## 2022-01-03 NOTE — Telephone Encounter (Addendum)
Medication Refill - Medication: lisinopril (ZESTRIL) 20 MG tablet   Has the patient contacted their pharmacy? No.  (Agent: If yes, when and what did the pharmacy advise?)  Preferred Pharmacy (with phone number or street name):  [place], [place] Phone:  [phone]  Fax:  [phone]      Has the patient been seen for an appointment in the last year OR does the patient have an upcoming appointment? Yes  Patient would like a follow up call regarding medication request.    Patient was told since she does not have any insurance and she would have to pay over $100 at the time of service. Patient states she does not work and cannot afford this payment. Informed patient of the orange card and self pay policy. Sent a CRM to the practice regarding [place] assistance and transportation services  Agent: Please be advised that RX refills may take up to 3 business days. We ask that you follow-up with your pharmacy.

## 2022-01-04 NOTE — Telephone Encounter (Signed)
Requested medication (s) are due for refill today: yes  Requested medication (s) are on the active medication list: yes  Last refill:  11/14/20  Future visit scheduled: yes  Notes to clinic:  Unable to refill per protocol, appointment needed.  Pt has future OV schedule, routing for approval.     Requested Prescriptions  Pending Prescriptions Disp Refills   lisinopril (ZESTRIL) 20 MG tablet 90 tablet 3    Sig: Take 1 tablet (20 mg total) by mouth daily.     Cardiovascular:  ACE Inhibitors Failed - 01/03/2022  2:02 PM      Failed - Cr in normal range and within 180 days    Creat  Date Value Ref Range Status  07/20/2015 0.40 (L) 0.50 - 1.10 mg/dL Final   Creatinine, Ser  Date Value Ref Range Status  05/19/2020 0.54 (L) 0.57 - 1.00 mg/dL Final   Creatinine, Urine  Date Value Ref Range Status  02/16/2020 <10 mg/dL Final    Comment:    REPEATED TO VERIFY         Failed - K in normal range and within 180 days    Potassium  Date Value Ref Range Status  05/19/2020 3.3 (L) 3.5 - 5.2 mmol/L Final         Failed - Valid encounter within last 6 months    Recent Outpatient Visits           1 year ago Blood pressure check   [place] [facility] Kerin Perna, NP   1 year ago Gestational diabetes mellitus (GDM) in second trimester controlled on oral hypoglycemic drug   [place], [place], NP       Future Appointments             In 2 weeks Kerin Perna, NP [place] - Patient is not pregnant      Passed - Last BP in normal range    BP Readings from Last 1 Encounters:  11/14/20 122/80

## 2022-01-21 ENCOUNTER — Ambulatory Visit (INDEPENDENT_AMBULATORY_CARE_PROVIDER_SITE_OTHER): Payer: Self-pay | Admitting: Primary Care
